# Patient Record
Sex: Female | Born: 1965
Health system: Southern US, Community
[De-identification: ages and names within clinical notes are randomized; demographics above are authoritative.]

## PROBLEM LIST (undated history)

## (undated) DIAGNOSIS — R000 Tachycardia, unspecified: Secondary | ICD-10-CM

## (undated) DIAGNOSIS — G43909 Migraine, unspecified, not intractable, without status migrainosus: Secondary | ICD-10-CM

## (undated) HISTORY — PX: TUBAL LIGATION: SHX77

## (undated) HISTORY — PX: ABDOMINAL HYSTERECTOMY: SHX81

## (undated) HISTORY — PX: CERVICAL ABLATION: SHX5771

---

## 2001-11-23 ENCOUNTER — Other Ambulatory Visit: Admission: RE | Admit: 2001-11-23 | Discharge: 2001-11-23 | Payer: Self-pay | Admitting: Obstetrics and Gynecology

## 2003-02-11 ENCOUNTER — Encounter: Payer: Self-pay | Admitting: Obstetrics and Gynecology

## 2003-02-11 ENCOUNTER — Ambulatory Visit (HOSPITAL_COMMUNITY): Admission: RE | Admit: 2003-02-11 | Discharge: 2003-02-11 | Payer: Self-pay | Admitting: Obstetrics and Gynecology

## 2003-03-17 ENCOUNTER — Other Ambulatory Visit: Admission: RE | Admit: 2003-03-17 | Discharge: 2003-03-17 | Payer: Self-pay | Admitting: Obstetrics & Gynecology

## 2003-04-15 ENCOUNTER — Ambulatory Visit (HOSPITAL_COMMUNITY): Admission: RE | Admit: 2003-04-15 | Discharge: 2003-04-15 | Payer: Self-pay | Admitting: Obstetrics & Gynecology

## 2005-05-18 ENCOUNTER — Inpatient Hospital Stay (HOSPITAL_COMMUNITY): Admission: RE | Admit: 2005-05-18 | Discharge: 2005-05-20 | Payer: Self-pay | Admitting: Obstetrics & Gynecology

## 2005-05-18 ENCOUNTER — Encounter: Payer: Self-pay | Admitting: Obstetrics & Gynecology

## 2006-07-13 ENCOUNTER — Ambulatory Visit (HOSPITAL_COMMUNITY): Admission: RE | Admit: 2006-07-13 | Discharge: 2006-07-13 | Payer: Self-pay | Admitting: Obstetrics & Gynecology

## 2007-07-17 ENCOUNTER — Ambulatory Visit (HOSPITAL_COMMUNITY): Admission: RE | Admit: 2007-07-17 | Discharge: 2007-07-17 | Payer: Self-pay | Admitting: Obstetrics & Gynecology

## 2007-07-25 ENCOUNTER — Encounter: Admission: RE | Admit: 2007-07-25 | Discharge: 2007-07-25 | Payer: Self-pay | Admitting: Obstetrics & Gynecology

## 2008-03-12 ENCOUNTER — Ambulatory Visit (HOSPITAL_COMMUNITY): Admission: RE | Admit: 2008-03-12 | Discharge: 2008-03-12 | Payer: Self-pay | Admitting: Emergency Medicine

## 2008-07-18 ENCOUNTER — Ambulatory Visit (HOSPITAL_COMMUNITY): Admission: RE | Admit: 2008-07-18 | Discharge: 2008-07-18 | Payer: Self-pay | Admitting: Internal Medicine

## 2009-01-14 ENCOUNTER — Encounter (HOSPITAL_COMMUNITY): Admission: RE | Admit: 2009-01-14 | Discharge: 2009-02-13 | Payer: Self-pay | Admitting: Cardiology

## 2009-01-14 ENCOUNTER — Encounter (INDEPENDENT_AMBULATORY_CARE_PROVIDER_SITE_OTHER): Payer: Self-pay | Admitting: Cardiology

## 2009-08-10 ENCOUNTER — Ambulatory Visit (HOSPITAL_COMMUNITY): Admission: RE | Admit: 2009-08-10 | Discharge: 2009-08-10 | Payer: Self-pay | Admitting: Internal Medicine

## 2010-10-10 ENCOUNTER — Encounter: Payer: Self-pay | Admitting: Obstetrics & Gynecology

## 2011-02-01 NOTE — Procedures (Signed)
NAME:  Kayla Meyer, Kayla Meyer NO.:  [id number]   MEDICAL RECORD NO.:  [id number]          PATIENT TYPE:  REC   LOCATION:  RAD                           FACILITY:  [facility]   PHYSICIAN:  Sheliah Mends, MD      DATE OF BIRTH:  [birth year]   DATE OF PROCEDURE:  DATE OF DISCHARGE:                                  STRESS TEST   INDICATION:  This is a 45 year old lady with complaints of palpitations  and multiple risk factors for coronary artery disease.  A cardiac stress  test was scheduled for further risk stratification.   PROCEDURE:  After informed consent was obtained, the patient was started  on a standard Bruce protocol.  She was followed with serial EKG and  hemodynamic monitoring every 3 minutes.  A Technetium Myoview tracer was  injected 1 minute prior to completion of exercise.   EKG FINDINGS:  Baseline EKG shows normal sinus rhythm at a rate of 58  beat per minute, normal axis, and no significant ST-T segment changes.  The patient exercised for 8 minutes and 46 seconds.  She achieved a  maximum heart rate of 170% exceeding 95% from maximum predicted heart  rate.  The patient had a hypertensive blood pressure response to  exercise with baseline blood pressure of 128/78, and a maximum blood  pressure during exercise of 228/62 beat per mmHg.  The serial EKGs  documented during the exercise portion showed 2-mm horizontal ST-  depressions and 2 free aVF and V4 to V6.  The patient did not develop  chest pain.  She did have dyspnea on exertion during exercise.  The EKG  changes resolved within 5 minutes of recovery.  There were no stress-  induced worrisome abnormalities seen.  There were no supraventricular or  ventricular ectopic beats or rhythm noted.   SUMMARY:  1. Adequate stress test based on target heart rate achieved, excellent      exercise tolerance.  2. EKGs positive for ischemia.  This may represent the falls, positive      result context of hypertensive  response to exercise.  3. Hypertensive response to exercise.  4. No evidence of stress-induced worrisome abnormalities.   The stress test was completed without complication.  The nuclear imaging  portion of the stress test will be reported separately.      Sheliah Mends, MD  Electronically Signed     JE/MEDQ  D:  01/14/2009  T:  01/15/2009  Job:  [id number]

## 2011-02-04 NOTE — H&P (Signed)
   NAME:  Kayla Meyer, Kayla Meyer NO.:  [id number]   MEDICAL RECORD NO.:  [id number]                  PATIENT TYPE:   LOCATION:                                       FACILITY:   PHYSICIAN:  Lazaro Arms, M.D.                DATE OF BIRTH:  1965/10/16   DATE OF ADMISSION:  DATE OF DISCHARGE:                                HISTORY & PHYSICAL   HISTORY OF PRESENT ILLNESS:  This patient is a 45 year old white female  gravida 2, para 2, status post tubal ligation.  She had a Pap smear  performed in the office in May 2004 which showed atypical squamous cells of  undetermined significance, but could not exclude high grade dysplasia.  She  also had high-risk HPV DNA and was negative for low risk.  As a result she  was brought into the office and had a colposcopy performed on June 28. At  the time of colposcopy I saw what I thought would turn out to be high-grade  dysplastic area, and this area was biopsied. It returned as moderate to high-  grade squamous dysplasia involving the endocervical glands and both lateral  margins. Due to the grade of the lesion and the patient's age I did not feel  that cryotherapy would be indicated; and, as a result, I have recommended a  laser ablation of the cervix.  The patient agrees and will proceed. That is  scheduled for April 15, 2003.   PAST MEDICAL HISTORY:  1. Migraines.  2. Hypothyroidism.   PAST SURGICAL HISTORY:  Tubal ligation.   PAST OBSTETRICAL HISTORY:  Two vaginal deliveries.   MEDICATIONS:  Synthroid 75 mcg.   REVIEW OF SYSTEMS:  Otherwise negative.   ALLERGIES:  BACTRIM and SULFA DRUGS.   SOCIAL HISTORY:  She does not currently smoke and alcohol only socially. She  works as a Engineer, civil (consulting) in the ICU.   PHYSICAL EXAMINATION:  HEENT:  Unremarkable.  NECK:  Thyroid is normal.  LUNGS:  Clear.  HEART:  Regular rhythm.  No regurgitation or gallop.  BREASTS: Deferred.  ABDOMEN:  Benign.  PELVIC:  Normal.  EXTREMITIES:  No edema.  NEUROLOGIC: Grossly intact.    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   PLAN:  The patient is admitted for laser ablation of the cervix.  She  understands the indications and will proceed.                                               Lazaro Arms, M.D.    [name]  D:  04/14/2003  T:  04/14/2003  Job:  [id number]

## 2011-02-04 NOTE — Op Note (Signed)
   NAME:  Kayla Meyer, Kayla Meyer                        ACCOUNT NO.:  [id number]   MEDICAL RECORD NO.:  [id number]                   PATIENT TYPE:  AMB   LOCATION:  DAY                                  FACILITY:  [facility]   PHYSICIAN:  Lazaro Arms, M.D.                DATE OF BIRTH:  1966-03-28   DATE OF PROCEDURE:  04/15/2003  DATE OF DISCHARGE:                                 OPERATIVE REPORT   PREOPERATIVE DIAGNOSIS:  Cervical dysplasia, high-grade.   POSTOPERATIVE DIAGNOSIS:  Cervical dysplasia, high-grade.   PROCEDURE:  Holmium laser ablation of the cervix.   SURGEON:  Lazaro Arms, M.D.   ANESTHESIA:  Laryngeal mask airway.   FINDINGS:  The patient underwent a colposcopy with directed biopsies in the  office after an abnormal Pap smear and was found to have high-grade squamous  intraepithelial lesion with invasion into the endocervical glands.  As a  result, we decided to do a laser ablation of the cervix.   DESCRIPTION OF PROCEDURE:  The patient was taken to the operating room and  placed in the supine position where she underwent a laryngeal mask airway  general anesthetic.  She was then placed in the dorsal lithotomy position.  The lower abdomen and vagina were prepped with Betadine.  The bladder was  drained.  A speculum was placed.  The cervix was grasped.  A paracervical  block was placed using 0.5% Marcaine with 1:200,000 epinephrine.  Acetic  acid was then placed in the cervix, and the holmium laser was used to do a  full transitional zone ablation of the cervix 7 cm in depth peripherally,  cutting down to 9 centrally.  There was good hemostasis with the holmium  laser, and Monsel's was placed for additional hemostasis.   The patient was awakened from anesthesia and taken to the recovery room in  good and stable condition.  All counts were correct.                                               Lazaro Arms, M.D.    [name]  D:  04/15/2003  T:  04/15/2003  Job:   [id number]

## 2011-02-04 NOTE — Discharge Summary (Signed)
Kayla Meyer, POSCH NO.:  [id number]   MEDICAL RECORD NO.:  [id number]          PATIENT TYPE:  INP   LOCATION:  A417                          FACILITY:  [facility]   PHYSICIAN:  Lazaro Arms, M.D.   DATE OF BIRTH:  08/13/1966   DATE OF ADMISSION:  05/18/2005  DATE OF DISCHARGE:  09/01/2006LH                                 DISCHARGE SUMMARY   DISCHARGE DIAGNOSES:  1.  Status post total abdominal hysterectomy/bilateral salpingo-      oophorectomy.  2.  Unremarkable postoperative course.   PROCEDURES:  As above.   HISTORY OF PRESENT ILLNESS:  Please refer to the transcribed History and  Physical and the operative report for details of admission in the hospital.   HOSPITAL COURSE:  The patient was admitted after surgery.  She tolerated  clear liquids and a regular diet.  She voided without symptoms and was  ambulatory.  She tolerated transition from IV to oral pain medicine.  Her  postop day #1 hemoglobin and hematocrit was 12.5 and 35.5.  Her incision was  clean, dry and intact.  She remained afebrile with stable vital signs.   She was discharged to home on the morning of postop day #2 in good and  stable condition.   FOLLOW UP:  Follow up in the office on Tuesday.   DISCHARGE MEDICATIONS:  1.  Tylox and Motrin for pain.  2.  Climara patch in place.      Lazaro Arms, M.D.  Electronically Signed     LHE/MEDQ  D:  05/20/2005  T:  05/20/2005  Job:  [id number]

## 2011-02-04 NOTE — H&P (Signed)
NAME:  KEIMANI, LAUFER NO.:  [id number]   MEDICAL RECORD NO.:  [id number]          PATIENT TYPE:  INP   LOCATION:  A417                          FACILITY:  [facility]   PHYSICIAN:  Lazaro Arms, M.D.   DATE OF BIRTH:  [birth year]   DATE OF ADMISSION:  05/18/2005  DATE OF DISCHARGE:  09/01/2006LH                                HISTORY & PHYSICAL   HISTORY OF PRESENT ILLNESS:  Kayla Meyer is a 45 year old white female, gravida 2,  para 2, status post tubal ligation in 1995, status post laser of the cervix  back in 2004 for high-grade dysplasia. She also suffers from migraines and  hypothyroidism.  Kayla Meyer has been having irregular bleeding now since the  beginning of 2005. She will have a regular period, and then 10 to 12 days  later she will start spotting again until her period starts.  Sometimes it  is spotting, but sometimes it is heavier bleeding and cramping associated.  We actually gave her some Megace to get her bleeding stopped originally,  then placed her on Yasmin, and she continued to have dysfunctional uterine  bleeding. We then placed her on Ovcon-35, and again that has had some  success, but she continues to have breakthrough bleeding despite the birth  control pills.  As a result, we discussed endometrial ablation versus  hysterectomy, and that was back in January. She also had a Pap smear which  came back showing low-grade dysplasia this time. We had to put her back on  Megace to control her bleeding, and she decided she wanted definitive  management with a hysterectomy.  She also has desire to have both of her  ovaries removed.   PAST MEDICAL HISTORY:  1.  Migraines.  2.  Hypothyroidism.  3.  Recurrent cervical dysplasia.   PAST SURGICAL HISTORY:  1.  Tubal ligation in 1995.  2.  Laser of cervix in 2004.   CURRENT MEDICATIONS:  1.  Synthroid 88 mcg a day.  2.  Megace 40 mg a day.  3.  Tylenol as needed.  4.  Ibuprofen as needed.   ALLERGIES:  SULFA  DRUGS.   REVIEW OF SYSTEMS:  Otherwise negative.   PHYSICAL EXAMINATION:  VITAL SIGNS:  Her weight is 195 pounds.  Blood  pressure 120/80.  HEENT:  Unremarkable.  NECK:  Thyroid normal.  LUNGS: Clear.  HEART:  Regular rate and rhythm without murmur, regurgitation, or gallop.  BREASTS:  Without mass, discharge, skin changes.  ABDOMEN:  Benign. No hepatosplenomegaly or masses.  PELVIC:  She has normal external genitalia.  Vagina is pink and moist  without discharge. The cervix is clear without lesions.  Uterus normal size,  shape, contour.  Ovaries normal and nontender.  EXTREMITIES:  Warm with no edema.  NEUROLOGIC:  Exam is grossly intact.   IMPRESSION:  1.  Dysfunctional uterine bleeding and menorrhagia despite hormone      manipulation for almost 2 years.  2.  Dysmenorrhea.  3.  Dyspareunia.   PLAN:  The patient is admitted to [facility].  She understands the risks,  benefits, indications, and  alternatives.  Will proceed.      Lazaro Arms, M.D.  Electronically Signed     LHE/MEDQ  D:  05/17/2005  T:  05/17/2005  Job:  [id number]

## 2011-02-04 NOTE — Op Note (Signed)
NAME:  Kayla Meyer, Kayla Meyer NO.:  [id number]   MEDICAL RECORD NO.:  [id number]          PATIENT TYPE:  AMB   LOCATION:  DAY                           FACILITY:  [facility]   PHYSICIAN:  Lazaro Arms, M.D.   DATE OF BIRTH:  1966-01-31   DATE OF PROCEDURE:  05/18/2005  DATE OF DISCHARGE:                                 OPERATIVE REPORT   PREOPERATIVE DIAGNOSIS:  1.  Dysfunctional uterine bleeding.  2.  Dysmenorrhea.  3.  Recurrent cervical dysplasia.   POSTOPERATIVE DIAGNOSIS:  1.  Dysfunctional uterine bleeding.  2.  Dysmenorrhea.  3.  Recurrent cervical dysplasia.   PROCEDURE:  Total abdominal hysterectomy/bilateral salpingo-oophorectomy.   SURGEON:  Dr. Despina Hidden.   ANESTHESIA:  General endotracheal.   FINDINGS:  The patient had what appeared to be a normal uterus, tubes, and  ovaries. There were no fibroids, endometriosis or ovarian cysts. The  peritoneal cavity was otherwise normal.   DESCRIPTION OF PROCEDURE:  The patient was taken to the operating room,  placed in the supine position where she underwent general endotracheal  anesthesia. She was then frog legged, the vagina was prepped, Foley catheter  was placed, and then she was placed in the supine position. Her abdomen was  prepped and draped in the usual sterile fashion. Pfannenstiel skin incision  was made and carried down sharply to the rectus fascia which was scored in  the midline and extended laterally. The fascia was taken off of the muscles  superiorly and inferiorly without difficulty. The muscles were divided. The  peritoneal cavity was entered. A Balfour self-retaining retractor was  placed. The upper abdomen was packed away for visualization. The uterine  cornu were grasped. The left round ligament was suture ligated and cut. The  infundibulopelvic ligament was isolated, clamped, cut, and double suture  ligated. The right round ligament was suture ligated and cut. The  infundibulopelvic ligament  on the right was isolated, clamped, and double  suture ligated. The uterine vessels were skeletonized bilaterally. Uterine  vessels were clamped, cut, and suture ligated. Serial pedicles were taken  down, the cervix through the cardinal ligament, each pedicle being clamped,  cut, and suture ligated. The vesicouterine serosal flap was, of course,  created prior to this, and the bladder was pushed off the lower uterine  segment without difficulty. The vaginal angles were approached. They were  clamped, cut, and transfixation suture ligated, and the vagina was closed.  With interrupted figure-of-eight sutures, the pelvis was irrigated  vigorously. There was some bleeding of the back of the bladder, and a couple  of 3-0 Monocryl sutures were placed with good hemostasis. Additional  irrigation was performed and again all pedicles hemostatic. Interceed was  placed over the vaginal cuff to try to prevent postoperative adhesion  perforation. The [place] and all of the packs were removed. The muscles in  the peritoneum were reapproximated loosely. The fascia  closed using 0 Vicryl running. Subcutaneous tissue was made hemostatic and  irrigated. Skin was closed using skin staples. The patient tolerated the  procedure well. She experienced 150 cc of blood loss  and was taken to  recovery in good and stable condition. All counts correct x3.      Lazaro Arms, M.D.  Electronically Signed     LHE/MEDQ  D:  05/18/2005  T:  05/18/2005  Job:  [id number]

## 2012-05-17 ENCOUNTER — Other Ambulatory Visit (HOSPITAL_COMMUNITY): Payer: Self-pay | Admitting: Family Medicine

## 2012-05-17 ENCOUNTER — Ambulatory Visit (HOSPITAL_COMMUNITY)
Admission: RE | Admit: 2012-05-17 | Discharge: 2012-05-17 | Disposition: A | Discharge status: Home / Self Care | Payer: 59 | Source: Ambulatory Visit | Attending: Family Medicine | Admitting: Family Medicine

## 2012-05-17 DIAGNOSIS — J069 Acute upper respiratory infection, unspecified: Secondary | ICD-10-CM

## 2012-05-17 DIAGNOSIS — J209 Acute bronchitis, unspecified: Secondary | ICD-10-CM | POA: Insufficient documentation

## 2012-05-29 ENCOUNTER — Other Ambulatory Visit (HOSPITAL_COMMUNITY): Payer: Self-pay | Admitting: Internal Medicine

## 2012-05-29 DIAGNOSIS — Z139 Encounter for screening, unspecified: Secondary | ICD-10-CM

## 2012-06-01 ENCOUNTER — Ambulatory Visit (HOSPITAL_COMMUNITY)
Admission: RE | Admit: 2012-06-01 | Discharge: 2012-06-01 | Disposition: A | Discharge status: Home / Self Care | Payer: 59 | Source: Ambulatory Visit | Attending: Internal Medicine | Admitting: Internal Medicine

## 2012-06-01 DIAGNOSIS — Z139 Encounter for screening, unspecified: Secondary | ICD-10-CM

## 2012-06-01 DIAGNOSIS — Z1231 Encounter for screening mammogram for malignant neoplasm of breast: Secondary | ICD-10-CM | POA: Insufficient documentation

## 2015-09-01 ENCOUNTER — Emergency Department (HOSPITAL_COMMUNITY)
Admission: EM | Admit: 2015-09-01 | Discharge: 2015-09-01 | Disposition: A | Discharge status: Home / Self Care | Payer: PRIVATE HEALTH INSURANCE | Attending: Emergency Medicine | Admitting: Emergency Medicine

## 2015-09-01 ENCOUNTER — Emergency Department (HOSPITAL_COMMUNITY): Payer: PRIVATE HEALTH INSURANCE

## 2015-09-01 ENCOUNTER — Encounter (HOSPITAL_COMMUNITY): Payer: Self-pay | Admitting: *Deleted

## 2015-09-01 ENCOUNTER — Telehealth: Payer: Self-pay | Admitting: Orthopedic Surgery

## 2015-09-01 DIAGNOSIS — Y998 Other external cause status: Secondary | ICD-10-CM | POA: Diagnosis not present

## 2015-09-01 DIAGNOSIS — S82832A Other fracture of upper and lower end of left fibula, initial encounter for closed fracture: Secondary | ICD-10-CM

## 2015-09-01 DIAGNOSIS — F17200 Nicotine dependence, unspecified, uncomplicated: Secondary | ICD-10-CM | POA: Insufficient documentation

## 2015-09-01 DIAGNOSIS — Z8679 Personal history of other diseases of the circulatory system: Secondary | ICD-10-CM | POA: Insufficient documentation

## 2015-09-01 DIAGNOSIS — S92352A Displaced fracture of fifth metatarsal bone, left foot, initial encounter for closed fracture: Secondary | ICD-10-CM | POA: Insufficient documentation

## 2015-09-01 DIAGNOSIS — S82492A Other fracture of shaft of left fibula, initial encounter for closed fracture: Secondary | ICD-10-CM | POA: Diagnosis not present

## 2015-09-01 DIAGNOSIS — X58XXXA Exposure to other specified factors, initial encounter: Secondary | ICD-10-CM | POA: Diagnosis not present

## 2015-09-01 DIAGNOSIS — Z79899 Other long term (current) drug therapy: Secondary | ICD-10-CM | POA: Insufficient documentation

## 2015-09-01 DIAGNOSIS — S99922A Unspecified injury of left foot, initial encounter: Secondary | ICD-10-CM | POA: Diagnosis present

## 2015-09-01 DIAGNOSIS — Y9289 Other specified places as the place of occurrence of the external cause: Secondary | ICD-10-CM | POA: Diagnosis not present

## 2015-09-01 DIAGNOSIS — Y9389 Activity, other specified: Secondary | ICD-10-CM | POA: Insufficient documentation

## 2015-09-01 HISTORY — DX: Tachycardia, unspecified: R00.0

## 2015-09-01 HISTORY — DX: Migraine, unspecified, not intractable, without status migrainosus: G43.909

## 2015-09-01 MED ORDER — OXYCODONE-ACETAMINOPHEN 5-325 MG PO TABS: 1.0000 | ORAL_TABLET | ORAL | Status: DC | PRN

## 2015-09-01 NOTE — Telephone Encounter (Signed)
Next week

## 2015-09-01 NOTE — Telephone Encounter (Signed)
Call received from patient, who states she is an employee of [employer], requesting appointment for work-related injury, date of occurrence 09/01/15, for which she was treated at [facility].  Relayed Workers Camera operatorcomp protocol for [facility]. States she has reported it to her supervisor, as well as Field seismologistcontacting Employee Health, and was advised to print off the form from website, per Horald PollenSue Hampton.  Appointment pending verification; patient aware of status.  Patient Ph# [phone]

## 2015-09-01 NOTE — ED Notes (Signed)
Pt c/o left ankle pain after turning around and hearing a popping sound in her ankle

## 2015-09-01 NOTE — Discharge Instructions (Signed)
Fibular Fracture With Rehab °The fibula is the smaller of the two lower leg bones and is vulnerable to breaks (fracture). Fibular fractures may go fully through the bone (complete) or partially (incomplete). The bone fragments are rarely out of alignment (displaced fracture). Fibula fractures may occur anywhere along the bone. However, this document only discusses fractures that do not involve a leg joint. Fibular fractures are not often a severe injury because the bone supports only about 17% of the body weight. °SYMPTOMS  °· Moderate to severe pain in the lower leg. °· Tenderness and swelling in the leg or calf. °· Bleeding and/or bruising (contusion) in the leg. °· Inability to bear weight on the injured extremity. °· Visible deformity, if the fracture is displaced. °· Numbness and coldness in the leg and foot, beyond the fracture site, if blood supply is impaired. °CAUSES  °Fractures occur when a force is placed on the bone that is greater than it can withstand. Common causes of fibular fracture include: °· Direct hit (trauma) (i.e., hockey or lacrosse check to the lower leg). °· Stress fracture (weakening of the bone from repeated stress). °· Indirect injury, caused by twisting, turning quickly, or violent muscle contraction. °RISK INCREASES WITH: °· Contact sports (i.e., football, soccer, lacrosse, hockey). °· Sports that can cause twisted ankle injury (i.e., skiing, basketball). °· Bony abnormalities (i.e., osteoporosis or bone tumors). °· Metabolism disorders, hormone problems, and nutrition deficiency and disorders (i.e., anorexia and bulimia). °· Poor strength and flexibility. °PREVENTION  °· Warm up and stretch properly before activity. °· Maintain physical fitness: °¨ Strength, flexibility, and endurance. °¨ Cardiovascular fitness. °· Wear properly fitted and padded protective equipment (i.e., shin guards for soccer). °PROGNOSIS  °If treated properly, fibular fractures usually heal in 4 to 6 weeks.    °RELATED COMPLICATIONS  °· Failure of bone to heal (nonunion). °· Bone heals in a poor position (malunion). °· Increased pressure inside the leg (compartment syndrome) due to injury that disrupts the blood supply to the leg and foot and injures the nerves and muscles (uncommon). °· Shortening of the injured bones. °· Hindrance of normal bone growth in children. °· Risks of surgery: infection, bleeding, injury to nerves (numbness, weakness, paralysis), need for further surgery. °· Longer healing time if activity is resumed too soon. °TREATMENT °Treatment first involves ice, medicine, and elevation of the leg to reduce pain and inflammation. People with fibular fractures are advised to walk using crutches. A brace or walking boot may be given to restrain the injured leg and allow for healing. Sometimes, surgery is needed to place a rod, plate, or screws in the bones in order to fix the fracture. After surgery, the leg is restrained. After restraint (with or without surgery), it is important to complete strengthening and stretching exercises to regain strength and a full range of motion. Exercises may be completed at home or with a therapist. °MEDICATION  °· If pain medicine is needed, nonsteroidal anti-inflammatory medicines (aspirin and ibuprofen), or other minor pain relievers (acetaminophen), are often advised. °· Do not take pain medicine for 7 days before surgery. °· Prescription pain relievers may be given if your health care provider thinks they are needed. Use only as directed and only as much as you need. °SEEK MEDICAL CARE IF: °· Symptoms get worse or do not improve in 2 weeks, despite treatment. °· The following occur after restraint or surgery. (Report any of these signs immediately): °¨ Swelling above or below the fracture site. °¨ Severe, persistent pain. °¨   Blue or gray skin below the fracture site, especially under the toenails. Numbness or loss of feeling below the fracture site. °¨ New, unexplained  symptoms develop. (Drugs used in treatment may produce side effects.) ° °

## 2015-09-01 NOTE — Telephone Encounter (Signed)
Contacted patient, and Workers comp contact Horald PollenSue Hampton at [facility], ph# [phone], with appointment information per Dr Mort SawyersHarrison's review: Monday, 09/07/15, 9:00a.m.  Forms in process of completion.

## 2015-09-07 ENCOUNTER — Ambulatory Visit (INDEPENDENT_AMBULATORY_CARE_PROVIDER_SITE_OTHER): Payer: Worker's Compensation

## 2015-09-07 ENCOUNTER — Encounter: Payer: Self-pay | Admitting: Orthopedic Surgery

## 2015-09-07 ENCOUNTER — Ambulatory Visit (INDEPENDENT_AMBULATORY_CARE_PROVIDER_SITE_OTHER): Payer: Worker's Compensation | Admitting: Orthopedic Surgery

## 2015-09-07 VITALS — HR 80 | Ht 64.0 in | Wt 225.0 lb

## 2015-09-07 DIAGNOSIS — S92252A Displaced fracture of navicular [scaphoid] of left foot, initial encounter for closed fracture: Secondary | ICD-10-CM

## 2015-09-07 DIAGNOSIS — S92352A Displaced fracture of fifth metatarsal bone, left foot, initial encounter for closed fracture: Secondary | ICD-10-CM | POA: Diagnosis not present

## 2015-09-07 DIAGNOSIS — S82832A Other fracture of upper and lower end of left fibula, initial encounter for closed fracture: Secondary | ICD-10-CM | POA: Diagnosis not present

## 2015-09-07 MED ORDER — IBUPROFEN 800 MG PO TABS: 800.0000 mg | ORAL_TABLET | Freq: Three times a day (TID) | ORAL | Status: DC | PRN

## 2015-09-07 MED ORDER — HYDROCODONE-ACETAMINOPHEN 7.5-325 MG PO TABS: 1.0000 | ORAL_TABLET | Freq: Four times a day (QID) | ORAL | Status: DC | PRN

## 2015-09-07 NOTE — Patient Instructions (Addendum)
WEIGHT BEARING AS TOLERATED IN CAM WALKER  OUT OF WORK 4 WEEKS

## 2015-09-07 NOTE — Progress Notes (Signed)
New patient with fracture  Workers Lawyer Complaint  Patient presents with  . 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 She presents to [place] with a new injury.  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 She was taken to the ER by security. X-rays and evaluation revealed she had 2 fractures one on the fibula and one on the proximal fifth metatarsal. Note plain films of the fifth metatarsal fracture were incomplete and had to be completed in the office.  She has continued constant pain and swelling left foot and left ankle over the fracture sites which originally was severe and now is more moderate and is improved with the use of a Cam Walker and crutches.    Foot Injury    Review of systems numbness and tingling negative vascular symptoms in the foot negative denies any other musculoskeletal pain and no fever and no skin issues  Ht  (1.626 m)  Wt 225 lb (102.059 kg)  BMI 38.60 kg/m2  She is awake alert and oriented 3. Mood affect normal. Appearance normal grooming hygiene. She is ambulatory today with a Cam Walker Left lower extremity examination normal range of motion of the hip and knee and no instability or tenderness in the hip and knee The foot is swollen as is the ankle. Tenderness in the proximal fifth metatarsal and distal fibula. Ankle range of motion is limited by pain but is near normal. Ankle stability tests are limited by pain and swelling but appear to be stable on drawer test  Skin is intact over the foot ankle. Sensation remains normal to soft touch we do see a bruise on the plantar aspect of the foot. Achilles tendon palpable intact. Good color and pulses are noted in the foot.  The original plain  films at [facility] are independently interpreted as follows. Ankle mortise intact distal fibula fracture, avulsed Independent interpretation of the plain films of the left foot taken in the office proximal avulsion fracture fifth metatarsal nondisplaced fracture gap and separation noted   Encounter Diagnoses  Name Primary?  . Fracture of fifth metatarsal bone of left foot, closed, initial encounter Yes  . Closed avulsion fracture of distal fibula, left, initial encounter    Plan of action, weightbearing as tolerated in Cam Walker  Patient's job duties require 12 hours of weightbearing and she will be out the next 4 weeks and then the fractures will be x-rayed, her pain swelling and ambulatory ability will be reassessed, and we will determine further out of work status at that point.  She is to continue ibuprofen 800, 3 times a day, finish her Percocet as needed and then she can take Norco 7.5 mg every 6  Meds ordered this encounter  Medications  . loratadine (CLARITIN) 10 MG tablet    Sig: Take 10 mg by mouth daily.  Marland Kitchen ibuprofen (ADVIL,MOTRIN) 800 MG tablet    Sig: Take 1 tablet (800 mg total) by mouth every 8 (eight) hours as needed.    Dispense:  90 tablet    Refill:  0  . HYDROcodone-acetaminophen (NORCO) 7.5-325 MG tablet    Sig: Take 1 tablet by mouth every 6 (six) hours as needed for moderate pain.    Dispense:  84 tablet    Refill:  0   ICD9 CODES 825.25 AND 824.8 ICD10 CODES S92.352A  And J81.191YS82.832A  Fracture care codes : metatarsal-28470  And fibula/lateral mall -  [phone]

## 2015-09-08 ENCOUNTER — Telehealth: Payer: Self-pay | Admitting: Orthopedic Surgery

## 2015-09-08 NOTE — Telephone Encounter (Signed)
Notes, date of service (intial visit) 09/07/15 faxed via HIM/ROI request to [facility], to attention: Threasa BeardsVanessa Corbett, fax#[phone]/ph# [phone] / patient aware.

## 2015-09-14 NOTE — ED Provider Notes (Signed)
CSN: [id number]     Arrival date & time 09/01/15  0201 History   First MD Initiated Contact with Patient 09/01/15 0226     Chief Complaint  Patient presents with  . Fall     (Consider location/radiation/quality/duration/timing/severity/associated sxs/prior Treatment) HPI   49 year old female with left foot/ankle pain. Acute onset shortly before arrival. Patient felt a pop while turning with a planted foot. Persistent pain over the lateral aspect of foot and ankle since then. Denies any other acute injuries. No acute numbness or tingling.  Past Medical History  Diagnosis Date  . Migraine   . Tachycardia    Past Surgical History  Procedure Laterality Date  . Abdominal hysterectomy    . Cervical ablation    . Tubal ligation     History reviewed. No pertinent family history. Social History  Substance Use Topics  . Smoking status: Current Every Day Smoker -- 0.33 packs/day  . Smokeless tobacco: None  . Alcohol Use: Yes     Comment: rarely   OB History    No data available     Review of Systems  All systems reviewed and negative, other than as noted in HPI.   Allergies  Sulfa antibiotics  Home Medications   Prior to Admission medications   Medication Sig Start Date End Date Taking? Authorizing Provider  HYDROcodone-acetaminophen (NORCO) 7.5-325 MG tablet Take 1 tablet by mouth every 6 (six) hours as needed for moderate pain. 09/07/15   Vickki Hearing, MD  ibuprofen (ADVIL,MOTRIN) 800 MG tablet Take 1 tablet (800 mg total) by mouth every 8 (eight) hours as needed. 09/07/15   Vickki Hearing, MD  loratadine (CLARITIN) 10 MG tablet Take 10 mg by mouth daily.    Historical Provider, MD  oxyCODONE-acetaminophen (PERCOCET/ROXICET) 5-325 MG tablet Take 1-2 tablets by mouth every 4 (four) hours as needed. 09/01/15   Raeford Razor, MD   BP 142/64 mmHg  Pulse 77  Temp(Src) 98 F (36.7 C) (Oral)  Resp 18  Ht  (1.626 m)  Wt 225 lb (102.059 kg)  BMI 38.60 kg/m2   SpO2 97% Physical Exam  Constitutional: She appears well-developed and well-nourished. No distress.  HENT:  Head: Normocephalic and atraumatic.  Eyes: Conjunctivae are normal. Right eye exhibits no discharge. Left eye exhibits no discharge.  Neck: Neck supple.  Cardiovascular: Normal rate, regular rhythm and normal heart sounds.  Exam reveals no gallop and no friction rub.   No murmur heard. Pulmonary/Chest: Effort normal and breath sounds normal. No respiratory distress.  Abdominal: Soft. She exhibits no distension. There is no tenderness.  Musculoskeletal:  Tenderness to palpation over the left lateral foot area the basis of the fourth and fifth metatarsals. Closed injury. Point tenderness to palpation over the lateral malleolus. Palpable DP pulse. Sensation is intact to light touch.  Neurological: She is alert.  Skin: Skin is warm and dry.  Psychiatric: She has a normal mood and affect. Her behavior is normal. Thought content normal.  Nursing note and vitals reviewed.   ED Course  Procedures (including critical care time) Labs Review Labs Reviewed - No data to display  Imaging Review No results found. I have personally reviewed and evaluated these images and lab results as part of my medical decision-making.   EKG Interpretation None      MDM   Final diagnoses:  Closed avulsion fracture of distal fibula, left, initial encounter  Dancer's fracture, left, closed, initial encounter    49 year old female was closed fractures of  the left foot and ankle. Neurovascularly intact. Will place a Cam Walker. Crutches. Orthopedic follow-up. As needed pain medication.    Raeford RazorStephen Jazier Mcglamery, MD 09/14/15 [phone]

## 2015-09-30 ENCOUNTER — Telehealth: Payer: Self-pay | Admitting: Orthopedic Surgery

## 2015-09-30 NOTE — Telephone Encounter (Signed)
Patient called to relay that she has been contacted by Workers comp/[facility], that she is being scheduled with another orthopedic surgeon for her follow up fracture care for work-related injury of 09/01/15; therefore, states she is to cancel her appointment with our office for 10/05/15; patient states that Workers comp Financial risk analystnurse case manager, with whom she has been assigned, Jonne PlyCindy Felix, was to call us directly to notify of this information.  We have not been notified by anyone from Workers comp as of yet. As per patient's call, I have cancelled the appointment with Dr Romeo AppleHarrison, as she was advised to do, and I have contacted the following Workers comp contacts: (1) Horald PollenSue Hampton, [facility], now known as "[employer]", Ph# [phone], Fax# [phone] (I left voice message requesting a call back in reference to this information); and (2) Alice ReichertSandra McDaniel, adjuster at [facility], Ph# [phone], Fax# [phone] (also left voice message requesting a call back regarding this information).  Patient is aware that I have called the Workers comp contacts directly.  She thanks all of us for providing excellent care.

## 2015-10-01 NOTE — Telephone Encounter (Signed)
Lots of great info   Not sure i need a phone note

## 2015-10-05 ENCOUNTER — Ambulatory Visit: Payer: Self-pay | Admitting: Orthopedic Surgery

## 2015-11-03 ENCOUNTER — Ambulatory Visit (HOSPITAL_COMMUNITY): Payer: PRIVATE HEALTH INSURANCE | Attending: Orthopedic Surgery | Admitting: Physical Therapy

## 2015-11-03 DIAGNOSIS — R2681 Unsteadiness on feet: Secondary | ICD-10-CM

## 2015-11-03 DIAGNOSIS — M25572 Pain in left ankle and joints of left foot: Secondary | ICD-10-CM

## 2015-11-03 DIAGNOSIS — M259 Joint disorder, unspecified: Secondary | ICD-10-CM | POA: Insufficient documentation

## 2015-11-03 DIAGNOSIS — R29898 Other symptoms and signs involving the musculoskeletal system: Secondary | ICD-10-CM

## 2015-11-03 DIAGNOSIS — M25672 Stiffness of left ankle, not elsewhere classified: Secondary | ICD-10-CM | POA: Diagnosis present

## 2015-11-03 DIAGNOSIS — R262 Difficulty in walking, not elsewhere classified: Secondary | ICD-10-CM | POA: Diagnosis present

## 2015-11-03 DIAGNOSIS — Z8781 Personal history of (healed) traumatic fracture: Secondary | ICD-10-CM | POA: Diagnosis present

## 2015-11-03 NOTE — Therapy (Signed)
[facility] [address] [place], [place], [address] Phone: [phone]   Fax:  [phone]  Physical Therapy Evaluation  Patient Details  Name: VALLEY KE MRN: [id number] Date of Birth: 1966/02/12 Referring Provider: Jodi Geralds   Encounter Date: 11/03/2015      PT End of Session - 11/03/15 0946    Visit Number 1   Number of Visits 12   Date for PT Re-Evaluation 12/01/15   Authorization Type Worker's Comp/UMR (primary is worker's comp and has limit of 12 sessions approved)   Authorization Time Period 11/03/15 to 01/01/16   Authorization - Visit Number 1   Authorization - Number of Visits 12   PT Start Time [phone]   PT Stop Time 0930   PT Time Calculation (min) 39 min   Activity Tolerance Patient tolerated treatment well   Behavior During Therapy [facility] for tasks assessed/performed      Past Medical History  Diagnosis Date  . Migraine   . Tachycardia     Past Surgical History  Procedure Laterality Date  . Abdominal hysterectomy    . Cervical ablation    . Tubal ligation      There were no vitals filed for this visit.  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 When she got to the bottom floor, she took some materials carts into the supply room, turned in the hallway, and fell down, fracturing the ankle. Does not remember what caused the fall. Since then she has had a total of 18 x-rays done on the  foot, lateral fibula and 5th metatarsal avlusion fracture as ultimate diagnosis. Extenstion is painful.    Pertinent History Hx of tachycardia, no other significant PMH in chart or patient reported. Reports she is now FWB with shoe. Hx 5th metatarsal and lateral fib fracture per patient.    How long can you stand comfortably? tends to stand with weight off of L foot, maybe could stand 15-20 minutes    How long can you walk comfortably? 30-60 minutes    Patient Stated Goals get walking better, get regular gait pattern back, get better at stairs    Currently in Pain? Yes   Pain Score 4    Pain Location Foot   Pain Orientation Left;Lateral   Pain Descriptors / Indicators Throbbing   Pain Type Acute pain   Pain Onset More than a month ago   Pain Frequency Intermittent   Aggravating Factors  walking and standing   Pain Relieving Factors brace, RICE             [facility] PT Assessment - 11/03/15 0001    Assessment   Medical Diagnosis hx of L ankle fracture    Referring Provider Jodi Geralds    Onset Date/Surgical Date 09/01/15   Next MD Visit 11/26/15 with Dr. Luiz Blare    Precautions   Precautions Other (comment)   Precaution Comments needs show for  weight bearing    Restrictions   Weight Bearing Restrictions No   Balance Screen   Has the patient fallen in the past 6 months Yes   How many times? one fall that caused fracture; no other falls in past 6 months    Has the patient had a decrease in activity level because of a fear of falling?  No   Is the patient reluctant to leave their home because of a fear of falling?  No   Prior Function   Level of Independence Independent with basic ADLs;Independent;Independent with gait;Independent with transfers   Vocation Full time employment   Vocation Requirements administrator/coordinator    Leisure walking   Observation/Other Assessments   Observations SLS R 37 seconds with intermittent fingertouch; SLS L 4 seconds   Focus on Therapeutic Outcomes  (FOTO)  55% limited    Posture/Postural Control   Posture Comments reduced weight bearing L LE    AROM   Right Ankle Dorsiflexion 5  AAROM 9   Right Ankle Plantar Flexion --  wfl    Right Ankle Inversion 35   Right Ankle Eversion 20   Left Ankle Dorsiflexion 5  8 AAROM    Left Ankle Plantar Flexion --  wfl    Left Ankle Inversion 18   Left Ankle Eversion 15   Strength   Right Knee Flexion 4/5   Right Knee Extension 4/5   Left Knee Flexion 4+/5   Left Knee Extension 4/5   Right Ankle Dorsiflexion 5/5   Right Ankle Inversion 5/5   Right Ankle Eversion 5/5   Left Ankle Dorsiflexion 3+/5   Left Ankle Inversion 3+/5   Left Ankle Eversion 3/5  tender, painful    Ambulation/Gait   Gait Comments reduced push off and DF L ankle, reduced gait speed, proximal muscle weakness    6 minute walk test results    Aerobic Endurance Distance Walked 407   Endurance additional comments ; gait speed 0.105m/s    High Level Balance   High Level Balance Comments TUG 13.17                            PT Education - 11/03/15 0946    Education provided Yes   Education Details prognosis, plan of care, HEP    Person(s) Educated Patient   Methods Explanation;Demonstration;Handout   Comprehension Verbalized understanding;Returned demonstration          PT Short Term Goals - 11/03/15 1002    PT SHORT TERM GOAL #1   Title Patient will demonstrate L ankle ROM equal to that of R ankle on all planes in order to assist in reducing pain and improving general mechanics and mobility    Time 3   Period Weeks   Status New   PT SHORT TERM GOAL #2   Title Patient to demonstrate reduced edema as evidenced by zero indentations from ankle brace in order to demosntrate general improvement of condition and to assist in improving ROM    Time 3   Period Weeks   Status New   PT SHORT TERM GOAL #3   Title Patient to demonstrate improved gait mechanics including equal weight bearing and  step lengths, improved push off, improved L ankle dorsiflexion, and improved gait spped to assist in safe returnt o community based activities    Time 3   Period Weeks   Status New   PT SHORT TERM GOAL #4   Title  Patient to be independent in correctly and consistently performing appropriate HEP, to be updated PRN    Time 3   Period Weeks   Status New           PT Long Term Goals - 11/03/15 1006    PT LONG TERM GOAL #1   Title Patient to demonstrate L ankle strength of 4+/5 on all planes in order to assist in improving general mechancis and regional stability    Time 6   Period Weeks   Status New   PT LONG TERM GOAL #2   Title Patient to be able to maintain SLS for at least 60 seconds with no finger tip touch or LOB in order to demonstrate readiness to return to dynamic activities    Time 6   Period Weeks   Status New   PT LONG TERM GOAL #3   Title Patient to report she has been able to perform regular walking program of at least 20 minutes, at least 3 days per week, in order to improve health habits and assist in returning to PLOF    Time 6   Period Weeks   Status New   PT LONG TERM GOAL #4   Title Patient to be able to ascend/descend full flght of stairs with no hand rail and reciprocal gait pattern, no unsteadiness and good eccentric control, in order to improve safety at home and in community and return to PLOF    Time 6   Period Weeks   Status New               Plan - 11/03/15 0950    Clinical Impression Statement Patient presents status post avulsion fracture of ankle which occured last December; she reports that she is now full weight bearing in her brace and has been working on a couple of ankle ROM exercises on her own at home, as well as on reciprocal stair climbing. Upon examination, patient demonstrates L ankle weakness and stiffness , impaired posture and gait with reduced weight bearing L LE, reduced ankle stability, difficulty maintaining SLS and  unsteadiness, and reduced functional task performance at home and in community. Patient does appear to be starting skilled PT services from a good baseline as she does not demonstrate as much ankle stiffness sa would necessarily be expected. Recommend skilled PT services in order to address functional limitations and assist patient in reaching optimal level of function.     Pt will benefit from skilled therapeutic intervention in order to improve on the following deficits Abnormal gait;Hypomobility;Increased edema;Decreased activity tolerance;Decreased strength;Pain;Decreased balance;Difficulty walking;Postural dysfunction   Rehab Potential Excellent   PT Frequency 2x / week   PT Duration 6 weeks   PT Treatment/Interventions ADLs/Self Care Home Management;Cryotherapy;Gait training;Stair training;Functional mobility training;Therapeutic activities;Therapeutic exercise;Balance training;Neuromuscular re-education;Patient/family education;Manual techniques;Taping   PT Next Visit Plan review HEP and goals; ankle ROM and balance, gait and stair training. Manual PRN.    PT Home Exercise Plan given, update regularly please    Consulted and Agree with Plan of Care Patient         Problem List Patient Active Problem List   Diagnosis Date Noted  . Fracture of fifth metatarsal bone of left foot 09/07/2015    Nedra Hai PT, DPT [phone]  [facility] [address] [place], [place], [address] Phone: [phone]   Fax:  [phone]  Name: BRYONNA SUNDBY MRN: [id number] Date of Birth: Oct 22, 1965

## 2015-11-03 NOTE — Patient Instructions (Signed)
   ANKLE CIRCLES  Move your ankle in a circular pattern one direction for several repetitions and then reverse the direction. Repeat 10-15 times clockwise and counterclockwise, 2-3 times per day.    ANKLE ABC's   While in a seated position, write out the alphabet in the air with your big toe.  Your ankle should be moving as you perform this; try not to move your lower leg or hip while you do it.   Repeat 2 times, 2-3 times per day.

## 2015-11-06 ENCOUNTER — Ambulatory Visit (HOSPITAL_COMMUNITY): Payer: PRIVATE HEALTH INSURANCE | Attending: Internal Medicine

## 2015-11-06 DIAGNOSIS — R2681 Unsteadiness on feet: Secondary | ICD-10-CM

## 2015-11-06 DIAGNOSIS — M25672 Stiffness of left ankle, not elsewhere classified: Secondary | ICD-10-CM | POA: Diagnosis not present

## 2015-11-06 DIAGNOSIS — R29898 Other symptoms and signs involving the musculoskeletal system: Secondary | ICD-10-CM

## 2015-11-06 DIAGNOSIS — R262 Difficulty in walking, not elsewhere classified: Secondary | ICD-10-CM | POA: Diagnosis not present

## 2015-11-06 DIAGNOSIS — M259 Joint disorder, unspecified: Secondary | ICD-10-CM | POA: Diagnosis not present

## 2015-11-06 DIAGNOSIS — M25572 Pain in left ankle and joints of left foot: Secondary | ICD-10-CM | POA: Diagnosis not present

## 2015-11-06 DIAGNOSIS — Z8781 Personal history of (healed) traumatic fracture: Secondary | ICD-10-CM | POA: Diagnosis not present

## 2015-11-06 NOTE — Therapy (Signed)
[facility] [address] [place], [place], [address] Phone: [phone]   Fax:  [phone]  Physical Therapy Treatment  Patient Details  Name: Kayla Meyer MRN: [id number] Date of Birth: 03-16-1966 Referring Provider: Jodi Geralds   Encounter Date: 11/06/2015      PT End of Session - 11/06/15 0851    Visit Number 2   Number of Visits 12   Date for PT Re-Evaluation 12/01/15   Authorization Type Worker's Comp/UMR (primary is worker's comp and has limit of 12 sessions approved)   Authorization Time Period 11/03/15 to 01/01/16   Authorization - Visit Number 2   Authorization - Number of Visits 12   PT Start Time 0847   PT Stop Time 0933   PT Time Calculation (min) 46 min   Activity Tolerance Patient tolerated treatment well   Behavior During Therapy [facility] for tasks assessed/performed      Past Medical History  Diagnosis Date  . Migraine   . Tachycardia     Past Surgical History  Procedure Laterality Date  . Abdominal hysterectomy    . Cervical ablation    . Tubal ligation      There were no vitals filed for this visit.  Visit Diagnosis:  History of ankle fracture  Ankle stiffness, left  Ankle weakness  Difficulty walking  Pain in joint, ankle and foot, left  Unsteadiness      Subjective Assessment - 11/06/15 0848    Subjective Pt reports compliance with HEP without questions, reports ankle mainly sore today.  No reports of pain today.   Pertinent History Hx of tachycardia, no other significant PMH in chart or patient reported. Reports she is now FWB with shoe. Hx 5th metatarsal and lateral fib fracture per patient.    Patient Stated Goals get walking better, get regular gait pattern back, get better at stairs    Currently in Pain? No/denies   Pain Descriptors / Indicators Sore               [facility] Adult PT Treatment/Exercise - 11/06/15 0001    Exercises   Exercises Ankle   Manual Therapy   Manual  Therapy Edema management   Manual therapy comments End of session separate from other treat,emt; supine position LE elevation   Edema Management Retro massage for edema control   Ankle Exercises: Stretches   Slant Board Stretch 3 reps;30 seconds   Ankle Exercises: Standing   BAPS Standing;Level 2;10 reps  all directions   SLS Rt 23", Lt 14" max of 3   Rocker Board 2 minutes  R/L and A/P   Other Standing Ankle Exercises Gait training for heel to toe pattern cueing for equal stance phase x 300 feet   Ankle Exercises: Seated   ABC's 2 reps  HEP   ABC's Limitations HEP   Ankle Circles/Pumps AROM;Left;15 reps   Ankle Circles/Pumps Limitations HEP             PT Short Term Goals - 11/03/15 1002    PT SHORT TERM GOAL #1   Title Patient will demonstrate L ankle ROM equal to that of R ankle on all planes in order to assist in reducing pain and improving general mechanics and mobility    Time 3   Period Weeks   Status New   PT SHORT TERM GOAL #2   Title Patient to demonstrate reduced edema as evidenced by zero indentations from ankle brace in order to demosntrate general improvement  of condition and to assist in improving ROM    Time 3   Period Weeks   Status New   PT SHORT TERM GOAL #3   Title Patient to demonstrate improved gait mechanics including equal weight bearing and step lengths, improved push off, improved L ankle dorsiflexion, and improved gait spped to assist in safe returnt o community based activities    Time 3   Period Weeks   Status New   PT SHORT TERM GOAL #4   Title Patient to be independent in correctly and consistently performing appropriate HEP, to be updated PRN    Time 3   Period Weeks   Status New           PT Long Term Goals - 11/03/15 1006    PT LONG TERM GOAL #1   Title Patient to demonstrate L ankle strength of 4+/5 on all planes in order to assist in improving general mechancis and regional stability    Time 6   Period Weeks   Status New    PT LONG TERM GOAL #2   Title Patient to be able to maintain SLS for at least 60 seconds with no finger tip touch or LOB in order to demonstrate readiness to return to dynamic activities    Time 6   Period Weeks   Status New   PT LONG TERM GOAL #3   Title Patient to report she has been able to perform regular walking program of at least 20 minutes, at least 3 days per week, in order to improve health habits and assist in returning to PLOF    Time 6   Period Weeks   Status New   PT LONG TERM GOAL #4   Title Patient to be able to ascend/descend full flght of stairs with no hand rail and reciprocal gait pattern, no unsteadiness and good eccentric control, in order to improve safety at home and in community and return to PLOF    Time 6   Period Weeks   Status New               Plan - 11/06/15 0935    Clinical Impression Statement Reviewed goals, compliance and technique with HEP and copy of eval given to pt.  Session focus on improving ankle mobilty and improving gait mechanics.  Cueing required for equal stance phase and heel to toe gait pattern to improve mechanics, began rocker board to improve weight distribution with gait.  Ended session with manual techniques for edema control, minimal swelling around ankle, majority or edema in gastroc/soleus complex with noted tightness.  Pt wil benefit with soft tissue mobilization techniques to gastroc/soleus complex.  No reports of pain through session   PT Next Visit Plan Continue with current PT POC to improve ankle ROM and balance, gait mechanics and stair training.  Manual PRN.  Next session increase to L3 BAPS for mobility, continue stretches and begin heel and toe raises for strengthening to improve gait mechanics.  Assess edema and complete soft tissue mobilization to gastroc/soleus complex.        Problem List Patient Active Problem List   Diagnosis Date Noted  . Fracture of fifth metatarsal bone of left foot 09/07/2015   Becky Sax, LPTA; CBIS [phone]  Juel Burrow 11/06/2015, 10:00 AM  [facility] [address] [place], [place], [address] Phone: [phone]   Fax:  [phone]  Name: ARONDA BURFORD MRN: [id number] Date of Birth:  10/01/1965     

## 2015-11-09 ENCOUNTER — Telehealth (HOSPITAL_COMMUNITY): Payer: Self-pay | Admitting: Physical Therapy

## 2015-11-09 ENCOUNTER — Ambulatory Visit (HOSPITAL_COMMUNITY): Payer: PRIVATE HEALTH INSURANCE

## 2015-11-09 DIAGNOSIS — M25572 Pain in left ankle and joints of left foot: Secondary | ICD-10-CM

## 2015-11-09 DIAGNOSIS — Z8781 Personal history of (healed) traumatic fracture: Secondary | ICD-10-CM | POA: Diagnosis not present

## 2015-11-09 DIAGNOSIS — R262 Difficulty in walking, not elsewhere classified: Secondary | ICD-10-CM

## 2015-11-09 DIAGNOSIS — M25672 Stiffness of left ankle, not elsewhere classified: Secondary | ICD-10-CM | POA: Diagnosis not present

## 2015-11-09 DIAGNOSIS — M259 Joint disorder, unspecified: Secondary | ICD-10-CM | POA: Diagnosis not present

## 2015-11-09 DIAGNOSIS — R2681 Unsteadiness on feet: Secondary | ICD-10-CM | POA: Diagnosis not present

## 2015-11-09 DIAGNOSIS — R29898 Other symptoms and signs involving the musculoskeletal system: Secondary | ICD-10-CM

## 2015-11-09 NOTE — Telephone Encounter (Signed)
Called the office of patient's referring MD and asked if patient can come out of brace and get into normal tennis shoes for weight bearing activities with PT. Staff reports that MD is out today and tomorrow but they will return our call with an answer later this week.  Nedra Hai PT, DPT [phone]

## 2015-11-09 NOTE — Therapy (Signed)
[place] [address] [place], [place], [address] Phone: [phone]   Fax:  [phone]  Physical Therapy Treatment  Patient Details  Name: Kayla Meyer MRN: [id number] Date of Birth: 1966-06-19 Referring Provider: Jodi Geralds   Encounter Date: 11/09/2015      PT End of Session - 11/09/15 1025    Visit Number 3   Number of Visits 12   Date for PT Re-Evaluation 12/01/15   Authorization Type Worker's Comp/UMR (primary is worker's comp and has limit of 12 sessions approved)   Authorization Time Period 11/03/15 to 01/01/16   Authorization - Visit Number 3   Authorization - Number of Visits 12   PT Start Time 1016   PT Stop Time 1103   PT Time Calculation (min) 47 min   Activity Tolerance Patient tolerated treatment well   Behavior During Therapy [facility] for tasks assessed/performed      Past Medical History  Diagnosis Date  . Migraine   . Tachycardia     Past Surgical History  Procedure Laterality Date  . Abdominal hysterectomy    . Cervical ablation    . Tubal ligation      There were no vitals filed for this visit.  Visit Diagnosis:  History of ankle fracture  Ankle stiffness, left  Ankle weakness  Difficulty walking  Pain in joint, ankle and foot, left  Unsteadiness      Subjective Assessment - 11/09/15 1020    Subjective Pt reports she is a little sore today, no reports of pain through session.     Pertinent History Hx of tachycardia, no other significant PMH in chart or patient reported. Reports she is now FWB with shoe. Hx 5th metatarsal and lateral fib fracture per patient.    Patient Stated Goals get walking better, get regular gait pattern back, get better at stairs    Currently in Pain? No/denies   Pain Descriptors / Indicators Sore              [facility] Adult PT Treatment/Exercise - 11/09/15 0001    Manual Therapy   Manual Therapy Soft tissue mobilization;Edema management   Manual therapy  comments End of session separate from other treatmemt; supine position LE elevation for retro massage and prone for STM for gastroc/soleus complex    Ankle Exercises: Standing   BAPS Standing;Level 3;10 reps  all directions   SLS Rt 43", Lt 36: max of 3   Rocker Board 2 minutes  R/L and A/P   Heel Raises 10 reps   Toe Raise 10 reps   Other Standing Ankle Exercises Gait training for heel to toe pattern cueing for equal stance phase x 226 feet   Ankle Exercises: Stretches   Slant Board Stretch 3 reps;30 seconds            PT Short Term Goals - 11/03/15 1002    PT SHORT TERM GOAL #1   Title Patient will demonstrate L ankle ROM equal to that of R ankle on all planes in order to assist in reducing pain and improving general mechanics and mobility    Time 3   Period Weeks   Status New   PT SHORT TERM GOAL #2   Title Patient to demonstrate reduced edema as evidenced by zero indentations from ankle brace in order to demosntrate general improvement of condition and to assist in improving ROM    Time 3   Period Weeks   Status New   PT  SHORT TERM GOAL #3   Title Patient to demonstrate improved gait mechanics including equal weight bearing and step lengths, improved push off, improved L ankle dorsiflexion, and improved gait spped to assist in safe returnt o community based activities    Time 3   Period Weeks   Status New   PT SHORT TERM GOAL #4   Title Patient to be independent in correctly and consistently performing appropriate HEP, to be updated PRN    Time 3   Period Weeks   Status New           PT Long Term Goals - 11/03/15 1006    PT LONG TERM GOAL #1   Title Patient to demonstrate L ankle strength of 4+/5 on all planes in order to assist in improving general mechancis and regional stability    Time 6   Period Weeks   Status New   PT LONG TERM GOAL #2   Title Patient to be able to maintain SLS for at least 60 seconds with no finger tip touch or LOB in order to  demonstrate readiness to return to dynamic activities    Time 6   Period Weeks   Status New   PT LONG TERM GOAL #3   Title Patient to report she has been able to perform regular walking program of at least 20 minutes, at least 3 days per week, in order to improve health habits and assist in returning to PLOF    Time 6   Period Weeks   Status New   PT LONG TERM GOAL #4   Title Patient to be able to ascend/descend full flght of stairs with no hand rail and reciprocal gait pattern, no unsteadiness and good eccentric control, in order to improve safety at home and in community and return to PLOF    Time 6   Period Weeks   Status New               Plan - 11/09/15 1324    Clinical Impression Statement Continued session focus on improving ankle mobility and progressed strengthening with heel and toe raises to improve gait mechanics.  Able to increase height with BAPS board to improve ankle mobilty without c/o increased pain.  Pt improve gait mechanics with heel to toe, increased difficulty with good roll to toe in boot requiring cueing to improve equal stance phase.  Discussion held with evaluation therapist about pt. brining tennis shoes to improve gait mechanics.  Evaluation PT called MD about therapy out of boot.  End of sessoin manual soft tissue mobilization techniques complete to reduce gastroc.soleus completx tightness.  No reports of pain at end of session, did report some soreness. Encouraged pt to continue mobiility exercises and ice for pain and edema control.     PT Next Visit Plan Continue with current PT POC to improve ankle ROM and balance, gait mechanics and stair training.  Manual PRN.  Assess edema and complete soft tissue mobilization to gastroc/soleus complex.        Problem List Patient Active Problem List   Diagnosis Date Noted  . Fracture of fifth metatarsal bone of left foot 09/07/2015   Becky Sax, LPTA; CBIS [phone]  Juel Burrow 11/09/2015,  5:07 PM  [facility] [address] [place], [place], [address] Phone: [phone]   Fax:  [phone]  Name: Kayla Meyer MRN: [id number] Date of Birth: Feb 09, 1966

## 2015-11-13 ENCOUNTER — Ambulatory Visit (HOSPITAL_COMMUNITY): Payer: PRIVATE HEALTH INSURANCE | Admitting: Physical Therapy

## 2015-11-13 DIAGNOSIS — Z8781 Personal history of (healed) traumatic fracture: Secondary | ICD-10-CM | POA: Diagnosis not present

## 2015-11-13 DIAGNOSIS — M25672 Stiffness of left ankle, not elsewhere classified: Secondary | ICD-10-CM

## 2015-11-13 DIAGNOSIS — M25572 Pain in left ankle and joints of left foot: Secondary | ICD-10-CM

## 2015-11-13 DIAGNOSIS — R262 Difficulty in walking, not elsewhere classified: Secondary | ICD-10-CM | POA: Diagnosis not present

## 2015-11-13 DIAGNOSIS — R2681 Unsteadiness on feet: Secondary | ICD-10-CM

## 2015-11-13 DIAGNOSIS — R29898 Other symptoms and signs involving the musculoskeletal system: Secondary | ICD-10-CM

## 2015-11-13 DIAGNOSIS — M259 Joint disorder, unspecified: Secondary | ICD-10-CM | POA: Diagnosis not present

## 2015-11-13 NOTE — Therapy (Signed)
[facility] [address] [place], [place], [address] Phone: [phone]   Fax:  [phone]  Physical Therapy Treatment  Patient Details  Name: Kayla Meyer MRN: [id number] Date of Birth: [birth year] Referring Provider: Jodi Geralds   Encounter Date: 11/13/2015      PT End of Session - 11/13/15 1036    Visit Number 4   Number of Visits 12   Date for PT Re-Evaluation 12/01/15   Authorization Type Worker's Comp/UMR (primary is worker's comp and has limit of 12 sessions approved)   Authorization Time Period 11/03/15 to 01/01/16   Authorization - Visit Number 4   Authorization - Number of Visits 12   PT Start Time 0852   PT Stop Time 0950   PT Time Calculation (min) 58 min   Activity Tolerance Patient tolerated treatment well   Behavior During Therapy [facility] for tasks assessed/performed      Past Medical History  Diagnosis Date  . Migraine   . Tachycardia     Past Surgical History  Procedure Laterality Date  . Abdominal hysterectomy    . Cervical ablation    . Tubal ligation      There were no vitals filed for this visit.  Visit Diagnosis:  History of ankle fracture  Ankle stiffness, left  Ankle weakness  Difficulty walking  Pain in joint, ankle and foot, left  Unsteadiness      Subjective Assessment - 11/13/15 0902    Subjective Pt states she is just getting off night shift but sat most of the night.  Reports soreness but no real pain this morning.    Currently in Pain? No/denies                         [facility] Adult PT Treatment/Exercise - 11/13/15 0001    Manual Therapy   Manual Therapy Soft tissue mobilization;Edema management   Manual therapy comments End of session separate from other treatmemt;  prone for STM for gastroc/soleus complex with elevation on wedge    Edema Management Retro massage for edema control   Ankle Exercises: Standing   BAPS Standing;Level 3;10 reps   SLS Rt 28", Lt  18: max of 3   Rocker Board 2 minutes   Heel Raises 15 reps   Toe Raise 15 reps   Heel Walk (Round Trip) 1 RT   Toe Walk (Round Trip) 1 RT   Other Standing Ankle Exercises Gait training for heel to toe pattern cueing for equal stance phase x 226 feet   Ankle Exercises: Stretches   Slant Board Stretch 3 reps;30 seconds                  PT Short Term Goals - 11/03/15 1002    PT SHORT TERM GOAL #1   Title Patient will demonstrate L ankle ROM equal to that of R ankle on all planes in order to assist in reducing pain and improving general mechanics and mobility    Time 3   Period Weeks   Status New   PT SHORT TERM GOAL #2   Title Patient to demonstrate reduced edema as evidenced by zero indentations from ankle brace in order to demosntrate general improvement of condition and to assist in improving ROM    Time 3   Period Weeks   Status New   PT SHORT TERM GOAL #3   Title Patient to demonstrate improved gait mechanics including equal weight bearing  and step lengths, improved push off, improved L ankle dorsiflexion, and improved gait spped to assist in safe returnt o community based activities    Time 3   Period Weeks   Status New   PT SHORT TERM GOAL #4   Title Patient to be independent in correctly and consistently performing appropriate HEP, to be updated PRN    Time 3   Period Weeks   Status New           PT Long Term Goals - 11/03/15 1006    PT LONG TERM GOAL #1   Title Patient to demonstrate L ankle strength of 4+/5 on all planes in order to assist in improving general mechancis and regional stability    Time 6   Period Weeks   Status New   PT LONG TERM GOAL #2   Title Patient to be able to maintain SLS for at least 60 seconds with no finger tip touch or LOB in order to demonstrate readiness to return to dynamic activities    Time 6   Period Weeks   Status New   PT LONG TERM GOAL #3   Title Patient to report she has been able to perform regular walking  program of at least 20 minutes, at least 3 days per week, in order to improve health habits and assist in returning to PLOF    Time 6   Period Weeks   Status New   PT LONG TERM GOAL #4   Title Patient to be able to ascend/descend full flght of stairs with no hand rail and reciprocal gait pattern, no unsteadiness and good eccentric control, in order to improve safety at home and in community and return to PLOF    Time 6   Period Weeks   Status New               Plan - 11/13/15 1037    Clinical Impression Statement Focus on improving ankle stability today with progression to heel and toe walks and continued work with established therex.   Pt unable to maintain SLS as long today due to fatigue from just getting off night shift.  Manual completed at end of session to help decrease edema. No reply received yet from MD regarding removing cast shoe for therapy.  PT reported she would have her case manager contact her MD office and let us know at next visit.  States she will bring her tennis shoe if able to complete without cast shoe.     PT Next Visit Plan Continue with current PT POC to improve ankle ROM and balance, gait mechanics and stair training.  Manual PRN.  Assess edema and complete soft tissue mobilization to gastroc/soleus complex.        Problem List Patient Active Problem List   Diagnosis Date Noted  . Fracture of fifth metatarsal bone of left foot 09/07/2015    Lurena Nida, PTA/CLT [phone]  11/13/2015, 11:04 AM  [place] [facility] [address] [place], [place], [address] Phone: [phone]   Fax:  [phone]  Name: Kayla Meyer MRN: [id number] Date of Birth: June 25, 1966

## 2015-11-17 ENCOUNTER — Ambulatory Visit (HOSPITAL_COMMUNITY): Payer: PRIVATE HEALTH INSURANCE

## 2015-11-17 DIAGNOSIS — M25572 Pain in left ankle and joints of left foot: Secondary | ICD-10-CM | POA: Diagnosis not present

## 2015-11-17 DIAGNOSIS — R2681 Unsteadiness on feet: Secondary | ICD-10-CM | POA: Diagnosis not present

## 2015-11-17 DIAGNOSIS — Z8781 Personal history of (healed) traumatic fracture: Secondary | ICD-10-CM

## 2015-11-17 DIAGNOSIS — R262 Difficulty in walking, not elsewhere classified: Secondary | ICD-10-CM

## 2015-11-17 DIAGNOSIS — M259 Joint disorder, unspecified: Secondary | ICD-10-CM | POA: Diagnosis not present

## 2015-11-17 DIAGNOSIS — M25672 Stiffness of left ankle, not elsewhere classified: Secondary | ICD-10-CM

## 2015-11-17 DIAGNOSIS — R29898 Other symptoms and signs involving the musculoskeletal system: Secondary | ICD-10-CM

## 2015-11-17 NOTE — Therapy (Signed)
[facility] [address] [place], [place], [address] Phone: [phone]   Fax:  [phone]  Physical Therapy Treatment  Patient Details  Name: Kayla Meyer MRN: [id number] Date of Birth: [birth year] Referring Provider: Jodi Geralds   Encounter Date: 11/17/2015      PT End of Session - 11/17/15 1036    Visit Number 5   Number of Visits 12   Date for PT Re-Evaluation 12/01/15   Authorization Type Worker's Comp/UMR (primary is worker's comp and has limit of 12 sessions approved)   Authorization Time Period 11/03/15 to 01/01/16   Authorization - Visit Number 5   Authorization - Number of Visits 12   PT Start Time 0853   PT Stop Time 0938   PT Time Calculation (min) 45 min   Activity Tolerance Patient tolerated treatment well   Behavior During Therapy [facility] for tasks assessed/performed      Past Medical History  Diagnosis Date  . Migraine   . Tachycardia     Past Surgical History  Procedure Laterality Date  . Abdominal hysterectomy    . Cervical ablation    . Tubal ligation      There were no vitals filed for this visit.  Visit Diagnosis:  History of ankle fracture  Ankle stiffness, left  Ankle weakness  Difficulty walking  Pain in joint, ankle and foot, left  Unsteadiness      Subjective Assessment - 11/17/15 0858    Subjective Pt stated she was a little sore today, no reports of pain today.  Reports compliance with HEP daily.   Pertinent History Hx of tachycardia, no other significant PMH in chart or patient reported. Reports she is now FWB with shoe. Hx 5th metatarsal and lateral fib fracture per patient.    Patient Stated Goals get walking better, get regular gait pattern back, get better at stairs    Currently in Pain? No/denies   Pain Location Foot   Pain Descriptors / Indicators Sore   Pain Onset More than a month ago   Pain Frequency Intermittent   Aggravating Factors  walking and standing   Pain  Relieving Factors brace, RICE            [facility] Adult PT Treatment/Exercise - 11/17/15 0001    Exercises   Exercises Ankle;Knee/Hip   Knee/Hip Exercises: Standing   Lateral Step Up 15 reps;Hand Hold: 1;Step Height: 4"   Forward Step Up 15 reps;Hand Hold: 1;Step Height: 4"   Manual Therapy   Manual Therapy Soft tissue mobilization;Edema management   Manual therapy comments End of session separate from other treatmemt;  prone for STM for gastroc/soleus complex with elevation on wedge    Edema Management Retro massage for edema control   Soft tissue mobilization gastroc/soleus complex prone position   Ankle Exercises: Standing   BAPS Standing;Level 3;10 reps   SLS Rt 60", Lt 39" max of 3   Rocker Board 2 minutes  A/P and R/L   Heel Raises 15 reps   Toe Raise 15 reps   Heel Walk (Round Trip) 1 RT   Toe Walk (Round Trip) 1 RT   Ankle Exercises: Stretches   Slant Board Stretch 3 reps;30 seconds            PT Short Term Goals - 11/03/15 1002    PT SHORT TERM GOAL #1   Title Patient will demonstrate L ankle ROM equal to that of R ankle on all planes in order to  assist in reducing pain and improving general mechanics and mobility    Time 3   Period Weeks   Status New   PT SHORT TERM GOAL #2   Title Patient to demonstrate reduced edema as evidenced by zero indentations from ankle brace in order to demosntrate general improvement of condition and to assist in improving ROM    Time 3   Period Weeks   Status New   PT SHORT TERM GOAL #3   Title Patient to demonstrate improved gait mechanics including equal weight bearing and step lengths, improved push off, improved L ankle dorsiflexion, and improved gait spped to assist in safe returnt o community based activities    Time 3   Period Weeks   Status New   PT SHORT TERM GOAL #4   Title Patient to be independent in correctly and consistently performing appropriate HEP, to be updated PRN    Time 3   Period Weeks   Status New            PT Long Term Goals - 11/03/15 1006    PT LONG TERM GOAL #1   Title Patient to demonstrate L ankle strength of 4+/5 on all planes in order to assist in improving general mechancis and regional stability    Time 6   Period Weeks   Status New   PT LONG TERM GOAL #2   Title Patient to be able to maintain SLS for at least 60 seconds with no finger tip touch or LOB in order to demonstrate readiness to return to dynamic activities    Time 6   Period Weeks   Status New   PT LONG TERM GOAL #3   Title Patient to report she has been able to perform regular walking program of at least 20 minutes, at least 3 days per week, in order to improve health habits and assist in returning to PLOF    Time 6   Period Weeks   Status New   PT LONG TERM GOAL #4   Title Patient to be able to ascend/descend full flght of stairs with no hand rail and reciprocal gait pattern, no unsteadiness and good eccentric control, in order to improve safety at home and in community and return to PLOF    Time 6   Period Weeks   Status New               Plan - 11/17/15 1039    Clinical Impression Statement Session focus on improving ankle stability and progressed functional strengthening this session.  Balance is improving with increased SLS time Bil LE.  Progressed to stair training for functional strengthening, pt able to demonstrate appropriate form and technique with all exercises.  Ended session with manual soft tissue mobilization and retro massage to reduce tightness gastroc/soleus complex and reduce edema for pain control and to improve gait mechanics  Pt stated slight increased on lateral malleoli during BAPS exercise, pain resolved following manual.     PT Next Visit Plan Continue with current PT POC to improve ankle ROM and balance, gait mechanics and stair training.  Manual PRN.  Assess edema and complete soft tissue mobilization to gastroc/soleus complex.        Problem List Patient Active  Problem List   Diagnosis Date Noted  . Fracture of fifth metatarsal bone of left foot 09/07/2015   Becky Sax, LPTA; CBIS [phone]  Juel Burrow 11/17/2015, 12:55 PM  [facility] [address] [place], [place], [address] Phone: [phone]   Fax:  [phone]  Name: Kayla Meyer MRN: [id number] Date of Birth: Mar 05, 1966

## 2015-11-19 ENCOUNTER — Ambulatory Visit (HOSPITAL_COMMUNITY): Payer: PRIVATE HEALTH INSURANCE | Attending: Orthopedic Surgery | Admitting: Physical Therapy

## 2015-11-19 DIAGNOSIS — Z8781 Personal history of (healed) traumatic fracture: Secondary | ICD-10-CM | POA: Diagnosis not present

## 2015-11-19 DIAGNOSIS — M25572 Pain in left ankle and joints of left foot: Secondary | ICD-10-CM | POA: Diagnosis not present

## 2015-11-19 DIAGNOSIS — R262 Difficulty in walking, not elsewhere classified: Secondary | ICD-10-CM

## 2015-11-19 DIAGNOSIS — M25672 Stiffness of left ankle, not elsewhere classified: Secondary | ICD-10-CM | POA: Diagnosis not present

## 2015-11-19 DIAGNOSIS — R29898 Other symptoms and signs involving the musculoskeletal system: Secondary | ICD-10-CM

## 2015-11-19 DIAGNOSIS — M259 Joint disorder, unspecified: Secondary | ICD-10-CM | POA: Insufficient documentation

## 2015-11-19 DIAGNOSIS — R2681 Unsteadiness on feet: Secondary | ICD-10-CM | POA: Diagnosis not present

## 2015-11-19 NOTE — Therapy (Signed)
[place] [facility] [address] [place], [place], [address] Phone: [phone]   Fax:  [phone]  Physical Therapy Treatment  Patient Details  Name: Kayla Meyer MRN: [id number] Date of Birth: [birth year] Referring Provider: Jodi Geralds   Encounter Date: 11/19/2015      PT End of Session - 11/19/15 1039    Visit Number 6   Number of Visits 12   Date for PT Re-Evaluation 12/01/15   Authorization Type Worker's Comp/UMR (primary is worker's comp and has limit of 12 sessions approved)   Authorization Time Period 11/03/15 to 01/01/16   Authorization - Visit Number 6   Authorization - Number of Visits 12   PT Start Time 0843   PT Stop Time 0930   PT Time Calculation (min) 47 min   Activity Tolerance Patient tolerated treatment well;No increased pain   Behavior During Therapy [facility] for tasks assessed/performed      Past Medical History  Diagnosis Date  . Migraine   . Tachycardia     Past Surgical History  Procedure Laterality Date  . Abdominal hysterectomy    . Cervical ablation    . Tubal ligation      There were no vitals filed for this visit.  Visit Diagnosis:  History of ankle fracture  Ankle stiffness, left  Ankle weakness  Difficulty walking  Unsteadiness      Subjective Assessment - 11/19/15 0845    Subjective Just got off work. Get's to get out of post-op shoe today and wear tennis shoes today. Had alot of cramping in the calves after last session.    Pertinent History Hx of tachycardia, no other significant PMH in chart or patient reported. Hx 5th metatarsal and lateral fib fracture per patient. Pt reports being cleared to wear tennis shoes now.    Currently in Pain? No/denies   Aggravating Factors  going up on toes and walking on the heels.    Pain Relieving Factors gentle active motion, ibuprofen as needed.                          [facility] Adult PT Treatment/Exercise - 11/19/15 0001    Ambulation/Gait   Gait Comments encouraging Lt plantarflexion for pushoff    Knee/Hip Exercises: Aerobic   Nustep L2 X   Knee/Hip Exercises: Standing   Lateral Step Up 15 reps;Hand Hold: 1;Step Height: 4"   Forward Step Up 15 reps;Hand Hold: 1;Step Height: 4"   Ankle Exercises: Standing   BAPS Standing;Level 3;10 reps   Rocker Board 2 minutes  ant/post and Rt/Lt   Heel Raises 15 reps   Toe Raise 15 reps   Heel Walk (Round Trip) 2 lenghts on parallel bars   Toe Walk (Round Trip) 2 lenghts on parallel bars   Other Standing Ankle Exercises SLS with forward/lateral/backward toe taps.   Ankle Exercises: Stretches   Slant Board Stretch 3 reps;30 seconds                PT Education - 11/19/15 1039    Education provided Yes   Education Details plantar flexion on Lt for pushoff with gait.   Person(s) Educated Patient   Methods Explanation;Demonstration;Verbal cues   Comprehension Verbalized understanding          PT Short Term Goals - 11/03/15 1002    PT SHORT TERM GOAL #1   Title Patient will demonstrate L ankle ROM equal to that of R ankle  on all planes in order to assist in reducing pain and improving general mechanics and mobility    Time 3   Period Weeks   Status New   PT SHORT TERM GOAL #2   Title Patient to demonstrate reduced edema as evidenced by zero indentations from ankle brace in order to demosntrate general improvement of condition and to assist in improving ROM    Time 3   Period Weeks   Status New   PT SHORT TERM GOAL #3   Title Patient to demonstrate improved gait mechanics including equal weight bearing and step lengths, improved push off, improved L ankle dorsiflexion, and improved gait spped to assist in safe returnt o community based activities    Time 3   Period Weeks   Status New   PT SHORT TERM GOAL #4   Title Patient to be independent in correctly and consistently performing appropriate HEP, to be updated PRN    Time 3   Period Weeks    Status New           PT Long Term Goals - 11/03/15 1006    PT LONG TERM GOAL #1   Title Patient to demonstrate L ankle strength of 4+/5 on all planes in order to assist in improving general mechancis and regional stability    Time 6   Period Weeks   Status New   PT LONG TERM GOAL #2   Title Patient to be able to maintain SLS for at least 60 seconds with no finger tip touch or LOB in order to demonstrate readiness to return to dynamic activities    Time 6   Period Weeks   Status New   PT LONG TERM GOAL #3   Title Patient to report she has been able to perform regular walking program of at least 20 minutes, at least 3 days per week, in order to improve health habits and assist in returning to PLOF    Time 6   Period Weeks   Status New   PT LONG TERM GOAL #4   Title Patient to be able to ascend/descend full flght of stairs with no hand rail and reciprocal gait pattern, no unsteadiness and good eccentric control, in order to improve safety at home and in community and return to PLOF    Time 6   Period Weeks   Status New               Plan - 11/19/15 1040    Clinical Impression Statement Patient reporting that she has been cleared to use regular tennis shoes as of today. During session the patient demonstrates difficulty with Lt single leg activities. Attempting to incorporate both static and controlled dynamic balance activities. Patient declined MT today, preferring to focus on strength and balance.   PT Next Visit Plan Encourage plantarflexion on Lt with gait for even pushoff. Work on progression from static to gradually more dynamic balance activites. Consider SLS/split stance with UE/LE motion for increased challenge.    PT Home Exercise Plan review as needed.    Consulted and Agree with Plan of Care Patient        Problem List Patient Active Problem List   Diagnosis Date Noted  . Fracture of fifth metatarsal bone of left foot 09/07/2015    Christiane Ha, PT,  CSCS Pager [phone]  11/19/2015, 10:45 AM  [facility] [address] [place], [place], [address]  Phone: [phone]   Fax:  [phone]  Name: Kayla Meyer MRN: [id number] Date of Birth: 05/22/1966

## 2015-11-23 ENCOUNTER — Ambulatory Visit (HOSPITAL_COMMUNITY): Payer: PRIVATE HEALTH INSURANCE | Admitting: Physical Therapy

## 2015-11-23 DIAGNOSIS — M259 Joint disorder, unspecified: Secondary | ICD-10-CM | POA: Diagnosis not present

## 2015-11-23 DIAGNOSIS — M25672 Stiffness of left ankle, not elsewhere classified: Secondary | ICD-10-CM | POA: Diagnosis not present

## 2015-11-23 DIAGNOSIS — R2681 Unsteadiness on feet: Secondary | ICD-10-CM

## 2015-11-23 DIAGNOSIS — M25572 Pain in left ankle and joints of left foot: Secondary | ICD-10-CM | POA: Diagnosis not present

## 2015-11-23 DIAGNOSIS — R262 Difficulty in walking, not elsewhere classified: Secondary | ICD-10-CM

## 2015-11-23 DIAGNOSIS — Z8781 Personal history of (healed) traumatic fracture: Secondary | ICD-10-CM

## 2015-11-23 DIAGNOSIS — R29898 Other symptoms and signs involving the musculoskeletal system: Secondary | ICD-10-CM

## 2015-11-23 NOTE — Therapy (Signed)
[place] [facility] [address] [place], [place], [address] Phone: [phone]   Fax:  [phone]  Physical Therapy Treatment  Patient Details  Name: Bedelia PersonKatherine M Huberty MRN: [id number] Date of Birth: [birth year] Referring Provider: Jodi GeraldsJohn Graves   Encounter Date: 11/23/2015      PT End of Session - 11/23/15 0905    Visit Number 7   Number of Visits 12   Date for PT Re-Evaluation 12/01/15   Authorization Type Worker's Comp/UMR (primary is worker's comp and has limit of 12 sessions approved)   Authorization Time Period 11/03/15 to 01/01/16   Authorization - Visit Number 7   Authorization - Number of Visits 12   PT Start Time 0845   PT Stop Time 0929   PT Time Calculation (min) 44 min   Activity Tolerance Patient tolerated treatment well      Past Medical History  Diagnosis Date  . Migraine   . Tachycardia     Past Surgical History  Procedure Laterality Date  . Abdominal hysterectomy    . Cervical ablation    . Tubal ligation      There were no vitals filed for this visit.  Visit Diagnosis:  History of ankle fracture  Ankle stiffness, left  Ankle weakness  Difficulty walking  Unsteadiness  Pain in joint, ankle and foot, left      Subjective Assessment - 11/23/15 0855    Subjective Pt states that overall she is feeling better.     Currently in Pain? Yes   Pain Score 1    Pain Location Ankle   Pain Orientation Left;Lateral   Pain Descriptors / Indicators Sore   Pain Type Chronic pain   Pain Onset More than a month ago   Pain Frequency Intermittent   Aggravating Factors  unsure    Pain Relieving Factors ibuprofen                          [facility] Adult PT Treatment/Exercise - 11/23/15 0001    Ambulation/Gait   Gait Comments encouraging Lt plantarflexion for pushoff    Balance Poses: Yoga   Tree Pose 2 reps;30 seconds   Exercises   Exercises Ankle   Knee/Hip Exercises: Aerobic   Nustep --   Knee/Hip  Exercises: Machines for Strengthening   Other Machine Ankle plantarflexion 3 Pl x 15    Knee/Hip Exercises: Standing   Heel Raises Both;15 reps   Lateral Step Up 15 reps;Hand Hold: 1;Step Height: 4"   Forward Step Up 15 reps;Hand Hold: 1;Step Height: 4"   SLS 60 seconds    SLS with Vectors 2 15"   Ankle Exercises: Stretches   Slant Board Stretch 3 reps;30 seconds   Ankle Exercises: Standing   BAPS Standing;Level 3;15 reps   Rocker Board 2 minutes  ant/post and Rt/Lt   Heel Raises 15 reps;Limitations   Heel Raises Limitations no hands    Toe Raise 15 reps   Toe Raise Limitations no hands    Heel Walk (Round Trip) 2 lenghts on parallel bars   Toe Walk (Round Trip) 2 lenghts on parallel bars   Other Standing Ankle Exercises split stance squats x 10 each    Ankle Exercises: Supine   T-Band all x 5                 PT Education - 11/23/15 [id number]    Education provided Yes   Education Details HEP  Person(s) Educated Patient   Methods Explanation;Handout;Demonstration;Verbal cues   Comprehension Verbalized understanding;Returned demonstration          PT Short Term Goals - 11/03/15 1002    PT SHORT TERM GOAL #1   Title Patient will demonstrate L ankle ROM equal to that of R ankle on all planes in order to assist in reducing pain and improving general mechanics and mobility    Time 3   Period Weeks   Status New   PT SHORT TERM GOAL #2   Title Patient to demonstrate reduced edema as evidenced by zero indentations from ankle brace in order to demosntrate general improvement of condition and to assist in improving ROM    Time 3   Period Weeks   Status New   PT SHORT TERM GOAL #3   Title Patient to demonstrate improved gait mechanics including equal weight bearing and step lengths, improved push off, improved L ankle dorsiflexion, and improved gait spped to assist in safe returnt o community based activities    Time 3   Period Weeks   Status New   PT SHORT TERM GOAL #4    Title Patient to be independent in correctly and consistently performing appropriate HEP, to be updated PRN    Time 3   Period Weeks   Status New           PT Long Term Goals - 11/03/15 1006    PT LONG TERM GOAL #1   Title Patient to demonstrate L ankle strength of 4+/5 on all planes in order to assist in improving general mechancis and regional stability    Time 6   Period Weeks   Status New   PT LONG TERM GOAL #2   Title Patient to be able to maintain SLS for at least 60 seconds with no finger tip touch or LOB in order to demonstrate readiness to return to dynamic activities    Time 6   Period Weeks   Status New   PT LONG TERM GOAL #3   Title Patient to report she has been able to perform regular walking program of at least 20 minutes, at least 3 days per week, in order to improve health habits and assist in returning to PLOF    Time 6   Period Weeks   Status New   PT LONG TERM GOAL #4   Title Patient to be able to ascend/descend full flght of stairs with no hand rail and reciprocal gait pattern, no unsteadiness and good eccentric control, in order to improve safety at home and in community and return to PLOF    Time 6   Period Weeks   Status New               Plan - 11/23/15 0906    Clinical Impression Statement Pt improving in balance advanced treatment adding tree pose, vector stances, theraband resisted exercises as well as cybex plantarflexion for strength.  Pt needs minimal cuing for proper technique of exercises but noted fatigue of muscles with theraband activity.    Rehab Potential Excellent   PT Frequency 2x / week   PT Duration 6 weeks   PT Treatment/Interventions ADLs/Self Care Home Management;Cryotherapy;Gait training;Stair training;Functional mobility training;Therapeutic activities;Therapeutic exercise;Balance training;Neuromuscular re-education;Patient/family education;Manual techniques;Taping   PT Next Visit Plan Continue to progress balance and  strengthening exercises as tolerated.  Begin balance beam activities.    Consulted and Agree with Plan of Care Patient  Problem List Patient Active Problem List   Diagnosis Date Noted  . Fracture of fifth metatarsal bone of left foot 09/07/2015   Virgina Organ, PT CLT [phone] 11/23/2015, 9:32 AM  [place] [facility] [address] [place], [place], [address] Phone: [phone]   Fax:  [phone]  Name: LOYD SALVADOR MRN: [id number] Date of Birth: 05/10/1966

## 2015-11-23 NOTE — Patient Instructions (Signed)
Dorsiflexion: Resisted    Facing anchor, tubing around left foot, pull toward face.  Repeat _15___ times per set. Do __1__ sets per session. Do __2__ sessions per day.  http://orth.exer.us/8   Copyright  VHI. All rights reserved.  Eversion: Resisted    With right foot in tubing loop, hold tubing around other foot to resist and turn foot out. Repeat __15__ times per set. Do __1__ sets per session. Do __2__ sessions per day.  http://orth.exer.us/14   Copyright  VHI. All rights reserved.  Inversion: Resisted    Cross legs with right leg underneath, foot in tubing loop. Hold tubing around other foot to resist and turn foot in. Repeat 15____ times per set. Do ___1_ sets per session. Do _2___ sessions per day.  http://orth.exer.us/12   Copyright  VHI. All rights reserved.  Plantar Flexion: Resisted    Anchor behind, tubing around left foot, press down. Repeat _15___ times per set. Do __1__ sets per session. Do _2___ sessions per day.  http://orth.exer.us/10   Copyright  VHI. All rights reserved.

## 2015-11-25 ENCOUNTER — Ambulatory Visit (HOSPITAL_COMMUNITY): Payer: PRIVATE HEALTH INSURANCE | Admitting: Physical Therapy

## 2015-11-25 DIAGNOSIS — R2681 Unsteadiness on feet: Secondary | ICD-10-CM

## 2015-11-25 DIAGNOSIS — M25672 Stiffness of left ankle, not elsewhere classified: Secondary | ICD-10-CM | POA: Diagnosis not present

## 2015-11-25 DIAGNOSIS — R262 Difficulty in walking, not elsewhere classified: Secondary | ICD-10-CM

## 2015-11-25 DIAGNOSIS — M25572 Pain in left ankle and joints of left foot: Secondary | ICD-10-CM | POA: Diagnosis not present

## 2015-11-25 DIAGNOSIS — Z8781 Personal history of (healed) traumatic fracture: Secondary | ICD-10-CM | POA: Diagnosis not present

## 2015-11-25 DIAGNOSIS — R29898 Other symptoms and signs involving the musculoskeletal system: Secondary | ICD-10-CM

## 2015-11-25 DIAGNOSIS — M259 Joint disorder, unspecified: Secondary | ICD-10-CM | POA: Diagnosis not present

## 2015-11-25 NOTE — Therapy (Signed)
[place] [address] [place], [place], [address] Phone: [phone]   Fax:  [phone]  Physical Therapy Treatment; Progress Note  Patient Details  Name: Kayla Meyer MRN: [id number] Date of Birth: 26-Dec-1965 Referring Provider: Dorna Leitz  Encounter Date: 11/25/2015      PT End of Session - 11/25/15 0918    Visit Number 8   Number of Visits 12   Date for PT Re-Evaluation 12/01/15   Authorization Type Worker's Comp/UMR (primary is worker's comp and has limit of 12 sessions approved)   Authorization Time Period 11/03/15 to 01/01/16   Authorization - Visit Number 8   Authorization - Number of Visits 12   PT Start Time 0845   PT Stop Time 0930   PT Time Calculation (min) 45 min   Activity Tolerance Patient tolerated treatment well   Behavior During Therapy [facility] for tasks assessed/performed      Past Medical History  Diagnosis Date  . Migraine   . Tachycardia     Past Surgical History  Procedure Laterality Date  . Abdominal hysterectomy    . Cervical ablation    . Tubal ligation      There were no vitals filed for this visit.  Visit Diagnosis:  History of ankle fracture  Ankle stiffness, left  Ankle weakness  Difficulty walking  Unsteadiness  Pain in joint, ankle and foot, left      Subjective Assessment - 11/25/15 1210    Subjective Pt reports no pain today.  STates it is getting better.  Returns to MD tomorrow.   Currently in Pain? No/denies            [facility] PT Assessment - 11/25/15 0001    Assessment   Medical Diagnosis hx of L ankle fracture    Referring Provider john graves   Onset Date/Surgical Date 09/01/15   Next MD Visit 11/26/15 with Dr. Berenice Primas    Restrictions   Weight Bearing Restrictions No   Observation/Other Assessments   Observations SLS B 60 seconds no UE's (was 37 seconds with intermittent fingertouch; SLS L 4 seconds)   Focus on Therapeutic Outcomes (FOTO)  38% limited (was 55%  limited)   AROM   Left Ankle Dorsiflexion 8  12 degrees AAROM (was 5 degrees AROM, 8 AAROM)   Left Ankle Plantar Flexion --  WNL   Left Ankle Inversion 35  was 18   Left Ankle Eversion 15  was 15   Strength   Right Knee Flexion 4+/5  was 4   Right Knee Extension 4+/5  was 4   Left Knee Flexion 4+/5  was 4+   Left Knee Extension 4/5  was 4   Left Ankle Dorsiflexion 4/5  was 3+   Left Ankle Inversion 4-/5  was 3+   Left Ankle Eversion 3+/5  was 3   6 minute walk test results    Aerobic Endurance Distance Walked 578   Endurance additional comments 3MWT; gait speed 1.27ms    High Level Balance   High Level Balance Comments TUG 8.27   was 13.17     Strength and ROM measured by KDeniece Ree PT.                [facility] - [facility] 0001    Knee/Hip Exercises: Standing   SLS 60 seconds    SLS with Vectors 2 15"   Ankle Exercises: Standing   Heel Raises 15 reps;Limitations   Heel Raises  Limitations no hands    Toe Raise 15 reps   Toe Raise Limitations no hands    Heel Walk (Round Trip) 2 lenghts on parallel bars   Toe Walk (Round Trip) 2 lenghts on parallel bars   Other Standing Ankle Exercises split stance squats x 10 each                   PT Short Term Goals - 11/25/15 0925    PT SHORT TERM GOAL #1   Title Patient will demonstrate L ankle ROM equal to that of R ankle on all planes in order to assist in reducing pain and improving general mechanics and mobility    Time 3   Period Weeks   Status On-going   PT SHORT TERM GOAL #2   Title Patient to demonstrate reduced edema as evidenced by zero indentations from ankle brace in order to demosntrate general improvement of condition and to assist in improving ROM    Time 3   Period Weeks   Status Partially Met   PT SHORT TERM GOAL #3   Title Patient to demonstrate improved gait mechanics including equal weight bearing and step lengths, improved push off, improved L ankle  dorsiflexion, and improved gait spped to assist in safe returnt o community based activities    Time 3   Period Weeks   Status Achieved   PT SHORT TERM GOAL #4   Title Patient to be independent in correctly and consistently performing appropriate HEP, to be updated PRN    Time 3   Period Weeks   Status Achieved           PT Long Term Goals - 11/25/15 6629    PT LONG TERM GOAL #1   Title Patient to demonstrate L ankle strength of 4+/5 on all planes in order to assist in improving general mechancis and regional stability    Time 6   Period Weeks   Status Partially Met   PT LONG TERM GOAL #2   Title Patient to be able to maintain SLS for at least 60 seconds with no finger tip touch or LOB in order to demonstrate readiness to return to dynamic activities    Time 6   Period Weeks   Status Achieved   PT LONG TERM GOAL #3   Title Patient to report she has been able to perform regular walking program of at least 20 minutes, at least 3 days per week, in order to improve health habits and assist in returning to PLOF    Time 6   Period Weeks   Status On-going   PT LONG TERM GOAL #4   Title Patient to be able to ascend/descend full flght of stairs with no hand rail and reciprocal gait pattern, no unsteadiness and good eccentric control, in order to improve safety at home and in community and return to PLOF    Time 6   Period Weeks   Status Partially Met               Plan - 11/25/15 1205    Clinical Impression Statement Pt has completed 8 sessions with focus on improving Lt ankle stability, mobility and return to functional independence.  Pt has made great progress towards goals meeting 2/4 STG's and 1/4 LTG's.  Improvements include increased AROM and strength of Lt ankle, improved functional status with only 38% limitation (was 55%), improved gait velocity and overall quality of gait.  Pt is experiencing minimal pain  and discomfort overall and slowly returning to normal function.   Pt is able to negotiate stairs reciprocally without handrail, however with decreased speed and comfort.  Ankle Inversion and dynamic stablity of ankle is still limited.  Pt would benefit from continued PT services to address all remaining impairments.     Rehab Potential Excellent   PT Frequency 2x / week   PT Duration 6 weeks   PT Treatment/Interventions ADLs/Self Care Home Management;Cryotherapy;Gait training;Stair training;Functional mobility training;Therapeutic activities;Therapeutic exercise;Balance training;Neuromuscular re-education;Patient/family education;Manual techniques;Taping   PT Next Visit Plan Continue to progress balance and strengthening exercises as tolerated.   Await further instructions from MD; formal re-eval X 4 more sessions.   Begin balance beam activities.    Consulted and Agree with Plan of Care Patient        Problem List Patient Active Problem List   Diagnosis Date Noted  . Fracture of fifth metatarsal bone of left foot 09/07/2015    Teena Irani, PTA/CLT [phone]  11/25/2015, 12:17 PM  [place] [address] [place], [place], [address] Phone: [phone]   Fax:  [phone]  Name: Kayla Meyer MRN: [id number] Date of Birth: 1966-02-14

## 2015-12-08 ENCOUNTER — Encounter (HOSPITAL_COMMUNITY): Payer: Self-pay | Admitting: Physical Therapy

## 2015-12-10 ENCOUNTER — Encounter (HOSPITAL_COMMUNITY): Payer: Self-pay | Admitting: Physical Therapy

## 2015-12-11 ENCOUNTER — Ambulatory Visit (HOSPITAL_COMMUNITY): Payer: PRIVATE HEALTH INSURANCE | Admitting: Physical Therapy

## 2015-12-11 DIAGNOSIS — R262 Difficulty in walking, not elsewhere classified: Secondary | ICD-10-CM | POA: Diagnosis not present

## 2015-12-11 DIAGNOSIS — M25572 Pain in left ankle and joints of left foot: Secondary | ICD-10-CM

## 2015-12-11 DIAGNOSIS — R2681 Unsteadiness on feet: Secondary | ICD-10-CM

## 2015-12-11 DIAGNOSIS — Z8781 Personal history of (healed) traumatic fracture: Secondary | ICD-10-CM

## 2015-12-11 DIAGNOSIS — M25672 Stiffness of left ankle, not elsewhere classified: Secondary | ICD-10-CM | POA: Diagnosis not present

## 2015-12-11 DIAGNOSIS — R29898 Other symptoms and signs involving the musculoskeletal system: Secondary | ICD-10-CM

## 2015-12-11 DIAGNOSIS — M259 Joint disorder, unspecified: Secondary | ICD-10-CM | POA: Diagnosis not present

## 2015-12-11 NOTE — Therapy (Signed)
[place], [place], [address] Phone: [phone]   Fax:  [phone]  Physical Therapy Treatment  Patient Details  Name: Kayla Meyer MRN: [id number] Date of Birth: 01/20/1966 Referring Provider: Dorna Leitz  Encounter Date: 12/11/2015      PT End of Session - 12/11/15 0905    Visit Number 9   Number of Visits 12   Date for PT Re-Evaluation 12/01/15   Authorization Type Worker's Comp/UMR (primary is worker's comp and has limit of 12 sessions approved)   Authorization Time Period 11/03/15 to 01/01/16   Authorization - Visit Number 9   Authorization - Number of Visits 12   PT Start Time 0850   PT Stop Time 0930   PT Time Calculation (min) 40 min   Activity Tolerance Patient tolerated treatment well      Past Medical History  Diagnosis Date  . Migraine   . Tachycardia     Past Surgical History  Procedure Laterality Date  . Abdominal hysterectomy    . Cervical ablation    . Tubal ligation      There were no vitals filed for this visit.  Visit Diagnosis:  History of ankle fracture  Ankle stiffness, left  Ankle weakness  Difficulty walking  Unsteadiness  Pain in joint, ankle and foot, left      Subjective Assessment - 12/11/15 0852    Subjective Pt works third shift as a Marine scientist.  The last two days have been very busy which has caused her to be sore but no real pain.     Currently in Pain? No/denies               [facility] Adult PT Treatment/Exercise - 12/11/15 0001    Balance Poses: Yoga   Tree Pose 2 reps;30 seconds   Exercises   Exercises Ankle   Knee/Hip Exercises: Standing   Heel Raises Both;15 reps   Heel Raises Limitations toe raise x 15    Rocker Board 2 minutes   Rocker Board Limitations Anterior posterior no hands x 2'   Ankle Exercises: Stretches   Plantar Fascia Stretch 3 reps;30 seconds   Slant Board Stretch 3 reps;30 seconds   Ankle Exercises: Standing   BAPS  Standing;Level 3;15 reps   Vector Stance Left;3 reps;15 seconds   SLS Lt 30" x 3   on foam    Ankle Exercises: Sidelying   Ankle Inversion Left;10 reps   Ankle Inversion Weights (lbs) 4                  PT Short Term Goals - 12/11/15 0919    PT SHORT TERM GOAL #1   Title Patient will demonstrate L ankle ROM equal to that of R ankle on all planes in order to assist in reducing pain and improving general mechanics and mobility    Time 3   Period Weeks   Status Achieved   PT SHORT TERM GOAL #2   Title Patient to demonstrate reduced edema as evidenced by zero indentations from ankle brace in order to demosntrate general improvement of condition and to assist in improving ROM    Baseline just took brace off and working without brace this has caused minimal swelling    Time 3   Period Weeks   Status Partially Met   PT SHORT TERM GOAL #3   Title Patient to demonstrate improved gait mechanics including equal weight bearing and step lengths, improved push off,  improved L ankle dorsiflexion, and improved gait spped to assist in safe returnt o community based activities    Time 3   Period Weeks   Status Achieved   PT SHORT TERM GOAL #4   Title Patient to be independent in correctly and consistently performing appropriate HEP, to be updated PRN    Time 3   Period Weeks   Status Achieved           PT Long Term Goals - 12/11/15 0920    PT LONG TERM GOAL #1   Title Patient to demonstrate L ankle strength of 4+/5 on all planes in order to assist in improving general mechancis and regional stability    Time 6   Period Weeks   Status Partially Met   PT LONG TERM GOAL #2   Title Patient to be able to maintain SLS for at least 60 seconds with no finger tip touch or LOB in order to demonstrate readiness to return to dynamic activities    Time 6   Period Weeks   Status Achieved   PT LONG TERM GOAL #3   Title Patient to report she has been able to perform regular walking program  of at least 20 minutes, at least 3 days per week, in order to improve health habits and assist in returning to PLOF    Time 6   Period Weeks   Status On-going   PT LONG TERM GOAL #4   Title Patient to be able to ascend/descend full flght of stairs with no hand rail and reciprocal gait pattern, no unsteadiness and good eccentric control, in order to improve safety at home and in community and return to PLOF    Time 6   Period Weeks   Status Achieved               Plan - 12/11/15 0905    Clinical Impression Statement Pt continues to improve in all aspects of functioning.  Began vector stances and plantar fasicia stretch and sidelying inversion with  weight for improved stabilty .  Pt goal is to be able to complete full night of work without soreness.     Pt will benefit from skilled therapeutic intervention in order to improve on the following deficits Abnormal gait;Hypomobility;Increased edema;Decreased activity tolerance;Decreased strength;Pain;Decreased balance;Difficulty walking;Postural dysfunction   PT Frequency 2x / week   PT Duration 6 weeks   PT Treatment/Interventions ADLs/Self Care Home Management;Cryotherapy;Gait training;Stair training;Functional mobility training;Therapeutic activities;Therapeutic exercise;Balance training;Neuromuscular re-education;Patient/family education;Manual techniques;Taping   PT Next Visit Plan reassess next visit    Consulted and Agree with Plan of Care Patient        Problem List Patient Active Problem List   Diagnosis Date Noted  . Fracture of fifth metatarsal bone of left foot 09/07/2015  Rayetta Humphrey, PT CLT [phone] 12/11/2015, 9:31 AM  [place] [address] [place], [place], [address] Phone: [phone]   Fax:  [phone]  Name: Kayla Meyer MRN: [id number] Date of Birth: 1965-11-24

## 2015-12-15 ENCOUNTER — Ambulatory Visit (HOSPITAL_COMMUNITY): Payer: PRIVATE HEALTH INSURANCE | Admitting: Physical Therapy

## 2015-12-15 DIAGNOSIS — R262 Difficulty in walking, not elsewhere classified: Secondary | ICD-10-CM

## 2015-12-15 DIAGNOSIS — M259 Joint disorder, unspecified: Secondary | ICD-10-CM | POA: Diagnosis not present

## 2015-12-15 DIAGNOSIS — Z8781 Personal history of (healed) traumatic fracture: Secondary | ICD-10-CM | POA: Diagnosis not present

## 2015-12-15 DIAGNOSIS — M25572 Pain in left ankle and joints of left foot: Secondary | ICD-10-CM | POA: Diagnosis not present

## 2015-12-15 DIAGNOSIS — R2681 Unsteadiness on feet: Secondary | ICD-10-CM | POA: Diagnosis not present

## 2015-12-15 DIAGNOSIS — M25672 Stiffness of left ankle, not elsewhere classified: Secondary | ICD-10-CM | POA: Diagnosis not present

## 2015-12-15 DIAGNOSIS — R29898 Other symptoms and signs involving the musculoskeletal system: Secondary | ICD-10-CM

## 2015-12-15 NOTE — Therapy (Signed)
[facility], [place], [address] Phone: [phone]   Fax:  [phone]  Physical Therapy Treatment  Patient Details  Name: Kayla Meyer MRN: [id number] Date of Birth: Sep 02, 1966 Referring Provider: Dorna Leitz  Encounter Date: 12/15/2015      PT End of Session - 12/15/15 0844    Visit Number 10   Number of Visits 12   Date for PT Re-Evaluation 12/01/15   Authorization Type Worker's Comp/UMR (primary is worker's comp and has limit of 12 sessions approved)   Authorization Time Period 11/03/15 to 01/01/16   Authorization - Visit Number 10   Authorization - Number of Visits 12   PT Start Time 9381   PT Stop Time 0930   PT Time Calculation (min) 43 min      Past Medical History  Diagnosis Date  . Migraine   . Tachycardia     Past Surgical History  Procedure Laterality Date  . Abdominal hysterectomy    . Cervical ablation    . Tubal ligation      There were no vitals filed for this visit.  Visit Diagnosis:  History of ankle fracture  Ankle stiffness, left  Ankle weakness  Difficulty walking  Unsteadiness  Pain in joint, ankle and foot, left      Subjective Assessment - 12/15/15 0846    Subjective Pt works third shift states that she has been running all night.    Pertinent History Hx of tachycardia, no other significant PMH in chart or patient reported. Hx 5th metatarsal and lateral fib fracture per patient. Pt reports being cleared to wear tennis shoes now.    How long can you sit comfortably? no problem    How long can you stand comfortably? able to stand for 30 minutes was 15-20   How long can you walk comfortably? Pt is walking 10,000 steps;  She is working 12 hour shifts and walking about the whole time.    Patient Stated Goals get walking better, get regular gait pattern back, get better at stairs    Currently in Pain? Yes   Pain Score 4    Pain Location Ankle   Pain Orientation  Left;Lower   Pain Descriptors / Indicators Sore   Pain Type Chronic pain   Pain Onset More than a month ago   Pain Frequency Intermittent   Aggravating Factors  being up for prolong periods.    Pain Relieving Factors ibuprofen             [facility] PT Assessment - 12/15/15 0001    Assessment   Medical Diagnosis hx of L ankle fracture    Onset Date/Surgical Date 09/01/15   Next MD Visit 12/24/2015   Precautions   Precautions --  none at this time.    Restrictions   Weight Bearing Restrictions No   Prior Function   Level of Independence Independent with basic ADLs;Independent;Independent with gait;Independent with transfers   Vocation Full time employment   Vocation Requirements administrator/coordinator    Leisure walking   Observation/Other Assessments   Observations SLS Rt 60; Lt 60; was  R 37 seconds with intermittent fingertouch; SLS L 4 seconds   Posture/Postural Control   Posture Comments improved weight bearing but after being up for several hours she will increase in the limping.    AROM   Right Ankle Dorsiflexion 11   Right Ankle Plantar Flexion --  wfl    Right Ankle Inversion 35  Right Ankle Eversion 22   Left Ankle Dorsiflexion 10  5 on 2/14; 8 on 3/8   Left Ankle Plantar Flexion --  wfl    Left Ankle Inversion 32  was 18 on 2/14; 35 on 3/8   Left Ankle Eversion 22  was 15    Strength   Right Knee Flexion 4/5   Right Knee Extension 4/5   Left Knee Flexion 4+/5   Left Knee Extension 4/5   Right Ankle Dorsiflexion 5/5   Right Ankle Inversion 5/5   Right Ankle Eversion 5/5   Left Ankle Dorsiflexion 4+/5  3+/5 on 2/14; 4-/5 on 3/8   Left Ankle Inversion 5/5  was 3+ on 2/14; 4- on 3/8   Left Ankle Eversion 4+/5  was 3/5 then 3+/5    Ambulation/Gait   Gait Comments reduced push off and DF L ankle, reduced gait speed, proximal muscle weakness    6 minute walk test results    Aerobic Endurance Distance Walked 742  was 407'on 2/14; 578 on 3/8   Endurance  additional comments 3MWT; gait speed 0.28ms    High Level Balance   High Level Balance Comments TUG 7.43  was 13.73                     [facility] Adult PT Treatment/Exercise - 12/15/15 0001    Manual Therapy   Manual Therapy Soft tissue mobilization;Edema management   Manual therapy comments seperate from all other aspects of this treatment.     Edema Management Retro massage for edema control                  PT Short Term Goals - 12/11/15 0919    PT SHORT TERM GOAL #1   Title Patient will demonstrate L ankle ROM equal to that of R ankle on all planes in order to assist in reducing pain and improving general mechanics and mobility    Time 3   Period Weeks   Status Achieved   PT SHORT TERM GOAL #2   Title Patient to demonstrate reduced edema as evidenced by zero indentations from ankle brace in order to demosntrate general improvement of condition and to assist in improving ROM    Baseline just took brace off and working without brace this has caused minimal swelling    Time 3   Period Weeks   Status Partially Met   PT SHORT TERM GOAL #3   Title Patient to demonstrate improved gait mechanics including equal weight bearing and step lengths, improved push off, improved L ankle dorsiflexion, and improved gait spped to assist in safe returnt o community based activities    Time 3   Period Weeks   Status Achieved   PT SHORT TERM GOAL #4   Title Patient to be independent in correctly and consistently performing appropriate HEP, to be updated PRN    Time 3   Period Weeks   Status Achieved           PT Long Term Goals - 12/11/15 0920    PT LONG TERM GOAL #1   Title Patient to demonstrate L ankle strength of 4+/5 on all planes in order to assist in improving general mechancis and regional stability    Time 6   Period Weeks   Status Partially Met   PT LONG TERM GOAL #2   Title Patient to be able to maintain SLS for at least 60 seconds with no finger tip  touch or  LOB in order to demonstrate readiness to return to dynamic activities    Time 6   Period Weeks   Status Achieved   PT LONG TERM GOAL #3   Title Patient to report she has been able to perform regular walking program of at least 20 minutes, at least 3 days per week, in order to improve health habits and assist in returning to PLOF    Time 6   Period Weeks   Status On-going   PT LONG TERM GOAL #4   Title Patient to be able to ascend/descend full flght of stairs with no hand rail and reciprocal gait pattern, no unsteadiness and good eccentric control, in order to improve safety at home and in community and return to PLOF    Time 6   Period Weeks   Status Achieved               Plan - 12/15/15 0930    Clinical Impression Statement Pt reassessed today with balance and ROM now within normal limits;  Pt TUG and 3 minute test within normal limits as well.  Pt still has minimal deficits in strength.  She will be ready for discharge on Tuesday as schedule.    PT Next Visit Plan focus on strength and manual to decrease swelling as pt has returned to work.  Pt will need to do a foto at end session but may use this reassessment for discharge.         Problem List Patient Active Problem List   Diagnosis Date Noted  . Fracture of fifth metatarsal bone of left foot 09/07/2015    Rayetta Humphrey, PT CLT [phone] 12/15/2015, 9:33 AM  [facility] [address] [place], [place], [address] Phone: [phone]   Fax:  [phone]  Name: Kayla Meyer MRN: [id number] Date of Birth: [birth year]

## 2015-12-17 ENCOUNTER — Ambulatory Visit (HOSPITAL_COMMUNITY): Payer: PRIVATE HEALTH INSURANCE | Admitting: Physical Therapy

## 2015-12-17 DIAGNOSIS — M25672 Stiffness of left ankle, not elsewhere classified: Secondary | ICD-10-CM | POA: Diagnosis not present

## 2015-12-17 DIAGNOSIS — Z8781 Personal history of (healed) traumatic fracture: Secondary | ICD-10-CM

## 2015-12-17 DIAGNOSIS — R262 Difficulty in walking, not elsewhere classified: Secondary | ICD-10-CM | POA: Diagnosis not present

## 2015-12-17 DIAGNOSIS — M25572 Pain in left ankle and joints of left foot: Secondary | ICD-10-CM | POA: Diagnosis not present

## 2015-12-17 DIAGNOSIS — R2681 Unsteadiness on feet: Secondary | ICD-10-CM | POA: Diagnosis not present

## 2015-12-17 DIAGNOSIS — R29898 Other symptoms and signs involving the musculoskeletal system: Secondary | ICD-10-CM

## 2015-12-17 DIAGNOSIS — M259 Joint disorder, unspecified: Secondary | ICD-10-CM | POA: Diagnosis not present

## 2015-12-17 NOTE — Therapy (Signed)
[facility] [place], [place], [address] Phone: [phone]   Fax:  [phone]  Physical Therapy Treatment  Patient Details  Name: Kayla Meyer MRN: [id number] Date of Birth: February 19, 1966 Referring Provider: Dorna Leitz  Encounter Date: 12/17/2015      PT End of Session - 12/17/15 0937    Visit Number 11   Number of Visits 12   Date for PT Re-Evaluation 12/08/15   Authorization Type Worker's Comp/UMR (primary is worker's comp and has limit of 12 sessions approved)   Authorization Time Period 11/03/15 to 01/01/16   Authorization - Visit Number 11   Authorization - Number of Visits 12   PT Start Time 8325   PT Stop Time 0929   PT Time Calculation (min) 42 min   Activity Tolerance Patient tolerated treatment well   Behavior During Therapy [facility] for tasks assessed/performed      Past Medical History  Diagnosis Date  . Migraine   . Tachycardia     Past Surgical History  Procedure Laterality Date  . Abdominal hysterectomy    . Cervical ablation    . Tubal ligation      There were no vitals filed for this visit.  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    Pertinent History Hx of tachycardia, no other significant PMH in chart or patient reported. Hx 5th metatarsal and lateral fib fracture per patient. Pt reports being cleared to wear tennis shoes now.    Patient Stated Goals get walking better, get regular gait pattern back, get better at stairs    Currently in Pain? Yes   Pain Score 1    Pain Location Ankle   Pain Orientation Left                          [facility] Adult PT Treatment/Exercise - 12/17/15 0001    Manual Therapy   Manual Therapy Soft tissue mobilization;Edema management   Manual therapy comments seperate from all other aspects of this treatment.     Edema Management Retro massage for edema control   Soft tissue mobilization soft tissue mobilization to gastroc    Ankle Exercises: Stretches   Plantar Fascia Stretch 3 reps;30 seconds   Slant Board Stretch 3 reps;30 seconds   Ankle Exercises: Standing   BAPS Standing;Level 3;15 reps   Rocker Board 2 minutes;Other (comment)  AP and lateral L LE, fingertip touch    Heel Raises 15 reps;Limitations   Heel Raises Limitations no hands; on foam pad    Toe Raise 15 reps   Toe Raise Limitations no hands; foam pad    Heel Walk (Round Trip) 3 lengths of blue lines    Toe Walk (Round Trip) 3 lengths of blue lines    Other Standing Ankle Exercises lateral weight shifting on air pads x2 minutes    Other Standing Ankle Exercises 3 way hip holds 1x10 each side on foam                 PT Education - 12/17/15 4982  Education provided Yes   Education Details DC and FOTO next session    Person(s) Educated Patient   Methods Explanation   Comprehension Verbalized understanding          PT Short Term Goals - 12/11/15 0919    PT SHORT TERM GOAL #1   Title Patient will demonstrate L ankle ROM equal to that of R ankle on all planes in order to assist in reducing pain and improving general mechanics and mobility    Time 3   Period Weeks   Status Achieved   PT SHORT TERM GOAL #2   Title Patient to demonstrate reduced edema as evidenced by zero indentations from ankle brace in order to demosntrate general improvement of condition and to assist in improving ROM    Baseline just took brace off and working without brace this has caused minimal swelling    Time 3   Period Weeks   Status Partially Met   PT SHORT TERM GOAL #3   Title Patient to demonstrate improved gait mechanics  including equal weight bearing and step lengths, improved push off, improved L ankle dorsiflexion, and improved gait spped to assist in safe returnt o community based activities    Time 3   Period Weeks   Status Achieved   PT SHORT TERM GOAL #4   Title Patient to be independent in correctly and consistently performing appropriate HEP, to be updated PRN    Time 3   Period Weeks   Status Achieved           PT Long Term Goals - 12/11/15 0920    PT LONG TERM GOAL #1   Title Patient to demonstrate L ankle strength of 4+/5 on all planes in order to assist in improving general mechancis and regional stability    Time 6   Period Weeks   Status Partially Met   PT LONG TERM GOAL #2   Title Patient to be able to maintain SLS for at least 60 seconds with no finger tip touch or LOB in order to demonstrate readiness to return to dynamic activities    Time 6   Period Weeks   Status Achieved   PT LONG TERM GOAL #3   Title Patient to report she has been able to perform regular walking program of at least 20 minutes, at least 3 days per week, in order to improve health habits and assist in returning to PLOF    Time 6   Period Weeks   Status On-going   PT LONG TERM GOAL #4   Title Patient to be able to ascend/descend full flght of stairs with no hand rail and reciprocal gait pattern, no unsteadiness and good eccentric control, in order to improve safety at home and in community and return to PLOF    Time 6   Period Weeks   Status Achieved               Plan - 12/17/15 7342    Clinical Impression Statement Patient arrived today reporting she is doing very well, just feels that she needs to continue working on her strength. Focused on functional strength and balance today, progressed some exercises by having patient do them on foam pad or with one LE only; also intrdduced air pads today for ankle stabilization. Patient did complain of cramping sensation during more challenging exercises,  and PT finished session with manual to L ankle/gastroc today. Patient reported 0/10 pain and ankle fatigue at end of session;  she continues to do very well and remains on track for DC tuesday.    Pt will benefit from skilled therapeutic intervention in order to improve on the following deficits Abnormal gait;Hypomobility;Increased edema;Decreased activity tolerance;Decreased strength;Pain;Decreased balance;Difficulty walking;Postural dysfunction   Rehab Potential Excellent   PT Frequency 2x / week   PT Duration 6 weeks   PT Treatment/Interventions ADLs/Self Care Home Management;Cryotherapy;Gait training;Stair training;Functional mobility training;Therapeutic activities;Therapeutic exercise;Balance training;Neuromuscular re-education;Patient/family education;Manual techniques;Taping   PT Next Visit Plan FOTO and DC    PT Home Exercise Plan review as needed.    Consulted and Agree with Plan of Care Patient        Problem List Patient Active Problem List   Diagnosis Date Noted  . Fracture of fifth metatarsal bone of left foot 09/07/2015   Deniece Ree PT, DPT [facility] [address] [place], [place], [address] Phone: [phone]   Fax:  [phone]  Name: SANDRIKA SCHWINN MRN: [id number] Date of Birth: 06/11/1966

## 2015-12-22 ENCOUNTER — Ambulatory Visit (HOSPITAL_COMMUNITY): Payer: PRIVATE HEALTH INSURANCE | Attending: Orthopedic Surgery | Admitting: Physical Therapy

## 2015-12-22 DIAGNOSIS — M25672 Stiffness of left ankle, not elsewhere classified: Secondary | ICD-10-CM | POA: Diagnosis not present

## 2015-12-22 DIAGNOSIS — R2681 Unsteadiness on feet: Secondary | ICD-10-CM | POA: Insufficient documentation

## 2015-12-22 DIAGNOSIS — M259 Joint disorder, unspecified: Secondary | ICD-10-CM | POA: Insufficient documentation

## 2015-12-22 DIAGNOSIS — R29898 Other symptoms and signs involving the musculoskeletal system: Secondary | ICD-10-CM

## 2015-12-22 DIAGNOSIS — M25572 Pain in left ankle and joints of left foot: Secondary | ICD-10-CM | POA: Insufficient documentation

## 2015-12-22 DIAGNOSIS — Z8781 Personal history of (healed) traumatic fracture: Secondary | ICD-10-CM | POA: Insufficient documentation

## 2015-12-22 DIAGNOSIS — R262 Difficulty in walking, not elsewhere classified: Secondary | ICD-10-CM | POA: Diagnosis not present

## 2015-12-22 NOTE — Therapy (Signed)
[facility] [place], [place], [address] Phone: [phone]   Fax:  [phone]  Physical Therapy Treatment (Discharge)  Patient Details  Name: Kayla Meyer MRN: [id number] Date of Birth: April 28, 1966 Referring Provider: Dorna Leitz  Encounter Date: 12/22/2015      PT End of Session - 12/22/15 1227    Visit Number 12   Number of Visits 12   Authorization Type Worker's Comp/UMR (primary is worker's comp and has limit of 12 sessions approved)   Authorization Time Period 11/03/15 to 01/01/16   Authorization - Visit Number 12   Authorization - Number of Visits 12   PT Start Time 0848   PT Stop Time 0931   PT Time Calculation (min) 43 min   Activity Tolerance Patient tolerated treatment well   Behavior During Therapy [facility] for tasks assessed/performed      Past Medical History  Diagnosis Date  . Migraine   . Tachycardia     Past Surgical History  Procedure Laterality Date  . Abdominal hysterectomy    . Cervical ablation    . Tubal ligation      There were no vitals filed for this visit.  Visit Diagnosis:  History of ankle fracture  Ankle stiffness, left  Ankle weakness  Difficulty walking  Unsteadiness  Pain in joint, ankle and foot, left      Subjective Assessment - 12/22/15 0849    Subjective Patient reporst she is doing well, just got off work and feeling well. She did have some edema in her ankle this weekend, both actually, but she believes that this is because she was up on her feet a lot    Pertinent History Hx of tachycardia, no other significant PMH in chart or patient reported. Hx 5th metatarsal and lateral fib fracture per patient. Pt reports being cleared to wear tennis shoes now.    Currently in Pain? No/denies  little soreness but not enough to call this pain or discomfort                          [facility] Adult PT Treatment/Exercise - 12/22/15 0001    Knee/Hip Exercises:  Aerobic   Nustep nustep x6 minutes while PT prepared advanced HEP    Ankle Exercises: Stretches   Plantar Fascia Stretch 3 reps;30 seconds   Slant Board Stretch 3 reps;30 seconds   Ankle Exercises: Standing   BAPS Standing;Level 3;15 reps   Rocker Board 2 minutes;Other (comment)  U LE, AP and lateral    Heel Walk (Round Trip) 3 lengths of blue lines    Toe Walk (Round Trip) 3 lengths of blue lines    Other Standing Ankle Exercises --   Other Standing Ankle Exercises 3 way hip holds 1x10 each side on foam                 PT Education - 12/22/15 1226    Education provided Yes   Education Details advanced HEP, DC today    Person(s) Educated Patient   Methods Explanation   Comprehension Verbalized understanding          PT Short Term Goals - 12/22/15 1228    PT SHORT TERM GOAL #1   Title Patient will demonstrate L ankle ROM equal to that of R ankle on all planes in order to assist in reducing pain and improving general mechanics and mobility    Time 3   Period Weeks  Status Achieved   PT SHORT TERM GOAL #2   Title Patient to demonstrate reduced edema as evidenced by zero indentations from ankle brace in order to demosntrate general improvement of condition and to assist in improving ROM    Baseline just took brace off and working without brace this has caused minimal swelling    Time 3   Period Weeks   Status Partially Met   PT SHORT TERM GOAL #3   Title Patient to demonstrate improved gait mechanics including equal weight bearing and step lengths, improved push off, improved L ankle dorsiflexion, and improved gait spped to assist in safe returnt o community based activities    Time 3   Period Weeks   Status Achieved   PT SHORT TERM GOAL #4   Title Patient to be independent in correctly and consistently performing appropriate HEP, to be updated PRN    Time 3   Period Weeks   Status Achieved           PT Long Term Goals - 12/22/15 1229    PT LONG TERM GOAL  #1   Title Patient to demonstrate L ankle strength of 4+/5 on all planes in order to assist in improving general mechancis and regional stability    Time 6   Period Weeks   Status Partially Met   PT LONG TERM GOAL #2   Title Patient to be able to maintain SLS for at least 60 seconds with no finger tip touch or LOB in order to demonstrate readiness to return to dynamic activities    Time 6   Period Weeks   Status Achieved   PT LONG TERM GOAL #3   Title Patient to report she has been able to perform regular walking program of at least 20 minutes, at least 3 days per week, in order to improve health habits and assist in returning to PLOF    Time 6   Period Weeks   Status On-going   PT LONG TERM GOAL #4   Title Patient to be able to ascend/descend full flght of stairs with no hand rail and reciprocal gait pattern, no unsteadiness and good eccentric control, in order to improve safety at home and in community and return to PLOF    Time 6   Period Weeks   Status Achieved               Plan - 12/22/15 1227    Clinical Impression Statement Discharge treatment performed today; patient doing very well with no concerns, but does report she has had to do some edema control recently as she was on her feet a lot this weekend and had some swelling. Perofmred some advanced standing functional exercises and discussed her ucrrent HEP and appropriate adjustments to it. Placed patient on Nustep while PT designed appropriate advanced activities for patient to perform, including education of how to do retrograde massage. DC today due to high functional level.    Pt will benefit from skilled therapeutic intervention in order to improve on the following deficits Abnormal gait;Hypomobility;Increased edema;Decreased activity tolerance;Decreased strength;Pain;Decreased balance;Difficulty walking;Postural dysfunction   Rehab Potential Excellent   PT Next Visit Plan DC    PT Home Exercise Plan review as  needed.    Consulted and Agree with Plan of Care Patient        Problem List Patient Active Problem List   Diagnosis Date Noted  . Fracture of fifth metatarsal bone of left foot 09/07/2015    PHYSICAL  THERAPY DISCHARGE SUMMARY  Visits from Start of Care: 12  Current functional level related to goals / functional outcomes: Patient doing extremely well overall, independent in appropriate exercises and edema management and has basically returned to her PLOF at this point. Agreeable to DC today.    Remaining deficits: Edema, mild weakness and unsteadiness    Education / Equipment: Advanced HEP  Plan: Patient agrees to discharge.  Patient goals were met. Patient is being discharged due to being pleased with the current functional level.  ?????       Deniece Ree PT, DPT [facility] [address] [place], [place], [address] Phone: [phone]   Fax:  [phone]  Name: VERNEDA HOLLOPETER MRN: [id number] Date of Birth: 1966/09/05

## 2015-12-22 NOTE — Patient Instructions (Signed)
Narrowing Stance: Eyes Shut     Stand facing forward, eyes closed, with your feet as close together as possible. Stand __15__ seconds then repeat. You may open your eyes between repetitions.  Repeat __2__ times. Do___2_ sets per day.  http://bt.exer.us/261   Copyright  VHI. All rights reserved.     TANDEM STANCE   Stand with one foot directly in front of the other so that the toes of one foot touches the heel of the other. You may close your eyes or stand on a folded pillow to make it harder.     Maintain your balance, holding at least 15-20 seconds, then switch feet.   Do this 2 times each side, twice a day.    SINGLE LEG STANCE - SLS  Stand on one leg and maintain your balance. You may close your eyes or stand on a folded pillow to make this more difficult.  Hold at least 15-20 seconds and repeat 2 times each side, twice a day.    RETROGRADE MASSAGE  Sit with your left leg/ankle elevated; using only gentle pressure, stroke the skin starting from your toes and moving up towards your knee on all sides of your ankle. NEVER start at your knee and move down towards your toes. Do this for 5-10 minutes at a time, 1-2 times per day.

## 2016-03-03 DIAGNOSIS — Z6841 Body Mass Index (BMI) 40.0 and over, adult: Secondary | ICD-10-CM | POA: Diagnosis not present

## 2016-03-03 DIAGNOSIS — R945 Abnormal results of liver function studies: Secondary | ICD-10-CM | POA: Diagnosis not present

## 2016-03-03 DIAGNOSIS — E781 Pure hyperglyceridemia: Secondary | ICD-10-CM | POA: Diagnosis not present

## 2016-03-03 DIAGNOSIS — Z1389 Encounter for screening for other disorder: Secondary | ICD-10-CM | POA: Diagnosis not present

## 2016-03-03 DIAGNOSIS — E063 Autoimmune thyroiditis: Secondary | ICD-10-CM | POA: Diagnosis not present

## 2016-03-03 DIAGNOSIS — E669 Obesity, unspecified: Secondary | ICD-10-CM | POA: Diagnosis not present

## 2016-03-03 MED FILL — PHENTERMINE 37.5 MG TABLET: 37.5 | 30 days supply | Qty: 30 | Fill #0

## 2016-03-03 MED FILL — CIPROFLOXACIN HCL 500 MG TA: 500 | 7 days supply | Qty: 14 | Fill #0

## 2016-03-03 MED FILL — LEVOTHYROXINE 88 MCG TABLET: 88 | 90 days supply | Qty: 90 | Fill #0

## 2016-03-07 DIAGNOSIS — R945 Abnormal results of liver function studies: Secondary | ICD-10-CM | POA: Diagnosis not present

## 2016-03-07 DIAGNOSIS — E781 Pure hyperglyceridemia: Secondary | ICD-10-CM | POA: Diagnosis not present

## 2016-03-08 ENCOUNTER — Other Ambulatory Visit: Payer: Self-pay | Admitting: Internal Medicine

## 2016-03-08 DIAGNOSIS — R945 Abnormal results of liver function studies: Secondary | ICD-10-CM

## 2016-03-16 ENCOUNTER — Ambulatory Visit (HOSPITAL_COMMUNITY)
Admission: RE | Admit: 2016-03-16 | Discharge: 2016-03-16 | Disposition: A | Discharge status: Home / Self Care | Payer: 59 | Source: Ambulatory Visit | Attending: Internal Medicine | Admitting: Internal Medicine

## 2016-03-16 DIAGNOSIS — K760 Fatty (change of) liver, not elsewhere classified: Secondary | ICD-10-CM | POA: Insufficient documentation

## 2016-03-16 DIAGNOSIS — R945 Abnormal results of liver function studies: Secondary | ICD-10-CM | POA: Diagnosis not present

## 2016-03-31 MED FILL — PHENTERMINE 37.5 MG TABLET: 37.5 | 30 days supply | Qty: 30 | Fill #1

## 2016-04-01 ENCOUNTER — Other Ambulatory Visit (HOSPITAL_COMMUNITY): Payer: Self-pay | Admitting: Internal Medicine

## 2016-04-01 DIAGNOSIS — Z1231 Encounter for screening mammogram for malignant neoplasm of breast: Secondary | ICD-10-CM

## 2016-04-08 DIAGNOSIS — E063 Autoimmune thyroiditis: Secondary | ICD-10-CM | POA: Diagnosis not present

## 2016-04-11 MED FILL — LEVOTHYROXINE 75 MCG TABLET: 75 | 30 days supply | Qty: 30 | Fill #0

## 2016-04-13 ENCOUNTER — Ambulatory Visit (HOSPITAL_COMMUNITY)
Admission: RE | Admit: 2016-04-13 | Discharge: 2016-04-13 | Disposition: A | Discharge status: Home / Self Care | Payer: 59 | Source: Ambulatory Visit | Attending: Internal Medicine | Admitting: Internal Medicine

## 2016-04-13 DIAGNOSIS — Z1231 Encounter for screening mammogram for malignant neoplasm of breast: Secondary | ICD-10-CM | POA: Diagnosis not present

## 2016-05-04 MED FILL — PHENTERMINE 37.5 MG TABLET: 37.5 | 30 days supply | Qty: 30 | Fill #2

## 2016-05-13 DIAGNOSIS — E063 Autoimmune thyroiditis: Secondary | ICD-10-CM | POA: Diagnosis not present

## 2016-05-24 DIAGNOSIS — Z1389 Encounter for screening for other disorder: Secondary | ICD-10-CM | POA: Diagnosis not present

## 2016-05-24 DIAGNOSIS — J042 Acute laryngotracheitis: Secondary | ICD-10-CM | POA: Diagnosis not present

## 2016-05-24 DIAGNOSIS — J9801 Acute bronchospasm: Secondary | ICD-10-CM | POA: Diagnosis not present

## 2016-05-24 DIAGNOSIS — J019 Acute sinusitis, unspecified: Secondary | ICD-10-CM | POA: Diagnosis not present

## 2016-05-24 DIAGNOSIS — E063 Autoimmune thyroiditis: Secondary | ICD-10-CM | POA: Diagnosis not present

## 2016-05-24 DIAGNOSIS — Z6839 Body mass index (BMI) 39.0-39.9, adult: Secondary | ICD-10-CM | POA: Diagnosis not present

## 2016-05-24 MED FILL — LEVOTHYROXINE 50 MCG TABLET: 50 | 60 days supply | Qty: 60 | Fill #0

## 2016-06-14 MED FILL — PHENTERMINE 37.5 MG TABLET: 37.5 | 30 days supply | Qty: 30 | Fill #0

## 2016-07-19 MED FILL — PHENTERMINE 37.5 MG TABLET: 37.5 | 30 days supply | Qty: 30 | Fill #1

## 2016-08-09 DIAGNOSIS — E039 Hypothyroidism, unspecified: Secondary | ICD-10-CM | POA: Diagnosis not present

## 2016-08-19 MED FILL — PHENTERMINE 37.5 MG TABLET: 37.5 | 30 days supply | Qty: 30 | Fill #2

## 2016-08-19 MED FILL — LEVOTHYROXINE 50 MCG TABLET: 50 | 90 days supply | Qty: 90 | Fill #0

## 2017-11-23 ENCOUNTER — Other Ambulatory Visit (HOSPITAL_COMMUNITY): Payer: Self-pay | Admitting: Internal Medicine

## 2017-11-23 DIAGNOSIS — Z1231 Encounter for screening mammogram for malignant neoplasm of breast: Secondary | ICD-10-CM

## 2017-11-30 ENCOUNTER — Encounter (HOSPITAL_COMMUNITY): Payer: Self-pay

## 2017-11-30 ENCOUNTER — Ambulatory Visit (HOSPITAL_COMMUNITY)
Admission: RE | Admit: 2017-11-30 | Discharge: 2017-11-30 | Disposition: A | Discharge status: Home / Self Care | Payer: No Typology Code available for payment source | Source: Ambulatory Visit | Attending: Internal Medicine | Admitting: Internal Medicine

## 2017-11-30 DIAGNOSIS — Z1231 Encounter for screening mammogram for malignant neoplasm of breast: Secondary | ICD-10-CM | POA: Diagnosis present

## 2019-02-05 ENCOUNTER — Other Ambulatory Visit (HOSPITAL_COMMUNITY): Payer: Self-pay | Admitting: Internal Medicine

## 2019-02-05 DIAGNOSIS — Z1231 Encounter for screening mammogram for malignant neoplasm of breast: Secondary | ICD-10-CM

## 2020-04-29 ENCOUNTER — Emergency Department (HOSPITAL_COMMUNITY)
Admission: EM | Admit: 2020-04-29 | Discharge: 2020-04-29 | Disposition: A | Discharge status: Home / Self Care | Payer: No Typology Code available for payment source | Attending: Emergency Medicine | Admitting: Emergency Medicine

## 2020-04-29 ENCOUNTER — Encounter (HOSPITAL_COMMUNITY): Payer: Self-pay | Admitting: Emergency Medicine

## 2020-04-29 ENCOUNTER — Other Ambulatory Visit: Payer: Self-pay

## 2020-04-29 DIAGNOSIS — X501XXA Overexertion from prolonged static or awkward postures, initial encounter: Secondary | ICD-10-CM | POA: Diagnosis not present

## 2020-04-29 DIAGNOSIS — F17200 Nicotine dependence, unspecified, uncomplicated: Secondary | ICD-10-CM | POA: Insufficient documentation

## 2020-04-29 DIAGNOSIS — Y999 Unspecified external cause status: Secondary | ICD-10-CM | POA: Insufficient documentation

## 2020-04-29 DIAGNOSIS — S39012A Strain of muscle, fascia and tendon of lower back, initial encounter: Secondary | ICD-10-CM | POA: Diagnosis not present

## 2020-04-29 DIAGNOSIS — Y929 Unspecified place or not applicable: Secondary | ICD-10-CM | POA: Insufficient documentation

## 2020-04-29 DIAGNOSIS — Y939 Activity, unspecified: Secondary | ICD-10-CM | POA: Insufficient documentation

## 2020-04-29 MED ORDER — METHOCARBAMOL 500 MG PO TABS: 500.0000 mg | ORAL_TABLET | Freq: Three times a day (TID) | ORAL | 0 refills | Status: DC | PRN

## 2020-04-29 MED ORDER — DEXAMETHASONE SODIUM PHOSPHATE 10 MG/ML IJ SOLN
10.0000 mg | Freq: Once | INTRAMUSCULAR | Status: AC
  Administered 2020-04-29: 10 mg via INTRAMUSCULAR
  Filled 2020-04-29: qty 1

## 2020-04-29 MED ORDER — HYDROCODONE-ACETAMINOPHEN 5-325 MG PO TABS: 1.0000 | ORAL_TABLET | ORAL | 0 refills | Status: DC | PRN

## 2020-04-29 MED ORDER — KETOROLAC TROMETHAMINE 60 MG/2ML IM SOLN
60.0000 mg | Freq: Once | INTRAMUSCULAR | Status: AC
  Administered 2020-04-29: 60 mg via INTRAMUSCULAR
  Filled 2020-04-29: qty 2

## 2020-04-29 NOTE — ED Triage Notes (Signed)
Pt states 1.5 days ago she stood from her couch and her lower back tightened up. Pt has been taking ibuprofen and tylenol with minimal relief.

## 2020-04-29 NOTE — ED Provider Notes (Signed)
[facility] EMERGENCY DEPARTMENT Provider Note   CSN: [id number] Arrival date & time: 04/29/20  0009     History Chief Complaint  Patient presents with  . Back Pain    Kayla Meyer is a 54 y.o. female.  Patient presents to the emergency department for evaluation of back pain.  Patient reports that the pain started 1-1/2 days ago when she stood up and felt a sharp pain in the right lower back.  Pain has been persistent since then.  She reports that it worsens if she tries to sit for a long time.  She also has noticed worsening with lifting the right leg.  Pain does not radiate down the leg.  No numbness, tingling or weakness of the lower extremities.  No change in bowel or bladder function.        Past Medical History:  Diagnosis Date  . Migraine   . Tachycardia     Patient Active Problem List   Diagnosis Date Noted  . Fracture of fifth metatarsal bone of left foot 09/07/2015    Past Surgical History:  Procedure Laterality Date  . ABDOMINAL HYSTERECTOMY    . CERVICAL ABLATION    . TUBAL LIGATION       OB History   No obstetric history on file.     No family history on file.  Social History   Tobacco Use  . Smoking status: Current Every Day Smoker    Packs/day: 0.33  . Smokeless tobacco: Never Used  Substance Use Topics  . Alcohol use: Yes    Comment: rarely  . Drug use: No    Home Medications Prior to Admission medications   Medication Sig Start Date End Date Taking? Authorizing Provider  HYDROcodone-acetaminophen (NORCO/VICODIN) 5-325 MG tablet Take 1-2 tablets by mouth every 4 (four) hours as needed. 04/29/20   Gilda Crease, MD  ibuprofen (ADVIL,MOTRIN) 800 MG tablet Take 1 tablet (800 mg total) by mouth every 8 (eight) hours as needed. 09/07/15   Vickki Hearing, MD  loratadine (CLARITIN) 10 MG tablet Take 10 mg by mouth daily.    [provider]  methocarbamol (ROBAXIN) 500 MG tablet Take 1 tablet (500 mg total) by mouth  every 8 (eight) hours as needed for muscle spasms. 04/29/20   Gilda Crease, MD    Allergies    Sulfa antibiotics  Review of Systems   Review of Systems  Musculoskeletal: Positive for back pain.  All other systems reviewed and are negative.   Physical Exam Updated Vital Signs BP (!) 147/77   Pulse 65   Temp 98.4 F (36.9 C)   Resp 18   Ht 5\' 4"  (1.626 m)   Wt 108.9 kg   SpO2 99%   BMI 41.20 kg/m   Physical Exam Vitals and nursing note reviewed.  Constitutional:      Appearance: Normal appearance.  HENT:     Head: Normocephalic.  Cardiovascular:     Rate and Rhythm: Normal rate and regular rhythm.  Pulmonary:     Effort: Pulmonary effort is normal.     Breath sounds: Normal breath sounds.  Musculoskeletal:     Cervical back: Neck supple.     Thoracic back: Normal.     Lumbar back: Spasms and tenderness present. No bony tenderness. Negative right straight leg raise test and negative left straight leg raise test.       Back:  Neurological:     Mental Status: She is alert.  Sensory: Sensation is intact.     Motor: Motor function is intact.     Coordination: Coordination is intact.     ED Results / Procedures / Treatments   Labs (all labs ordered are listed, but only abnormal results are displayed) Labs Reviewed - No data to display  EKG None  Radiology No results found.  Procedures Procedures (including critical care time)  Medications Ordered in ED Medications  ketorolac (TORADOL) injection 60 mg (60 mg Intramuscular Given 04/29/20 0119)  dexamethasone (DECADRON) injection 10 mg (10 mg Intramuscular Given 04/29/20 0120)    ED Course  I have reviewed the triage vital signs and the nursing notes.  Pertinent labs & imaging results that were available during my care of the patient were reviewed by me and considered in my medical decision making (see chart for details).    MDM Rules/Calculators/A&P                          Patient  presents to the ER with musculoskeletal back pain. Examination reveals back tenderness without any associated neurologic findings.  History seems to suggest acute spasm of paraspinal muscle.  Patient's strength, sensation and reflexes were normal. There is no evidence of saddle anesthesia. Patient does not have a foot drop. Patient has not experienced any change in bowel or bladder function. As such, patient did not require any imaging or further studies. Patient was treated with analgesia.  Discussed using warm compresses, stretching exercises to help with recovery.  Final Clinical Impression(s) / ED Diagnoses Final diagnoses:  Strain of lumbar region, initial encounter    Rx / DC Orders ED Discharge Orders         Ordered    HYDROcodone-acetaminophen (NORCO/VICODIN) 5-325 MG tablet  Every 4 hours PRN     Discontinue  Reprint     04/29/20 0121    methocarbamol (ROBAXIN) 500 MG tablet  Every 8 hours PRN     Discontinue     04/29/20 0121           Gilda Crease, MD 04/29/20 0121

## 2020-04-30 MED FILL — Hydrocodone-Acetaminophen Tab 5-325 MG: ORAL | Qty: 6 | Status: AC

## 2021-03-03 ENCOUNTER — Other Ambulatory Visit (HOSPITAL_COMMUNITY): Payer: Self-pay | Admitting: Internal Medicine

## 2021-03-03 DIAGNOSIS — Z1231 Encounter for screening mammogram for malignant neoplasm of breast: Secondary | ICD-10-CM

## 2021-03-04 ENCOUNTER — Other Ambulatory Visit (HOSPITAL_COMMUNITY): Payer: Self-pay

## 2021-03-04 MED ORDER — ESCITALOPRAM OXALATE 10 MG PO TABS
10.0000 mg | ORAL_TABLET | Freq: Every day | ORAL | 11 refills | Status: DC
  Filled 2021-03-04: qty 30, 30d supply, fill #0
  Filled 2021-04-05: qty 30, 30d supply, fill #1
  Filled 2021-05-04: qty 30, 30d supply, fill #2
  Filled 2021-06-02: qty 30, 30d supply, fill #3
  Filled 2021-07-05: qty 30, 30d supply, fill #4
  Filled 2021-08-02: qty 30, 30d supply, fill #5
  Filled 2021-08-30: qty 30, 30d supply, fill #6
  Filled 2021-10-03: qty 30, 30d supply, fill #7
  Filled 2021-11-01: qty 30, 30d supply, fill #8
  Filled 2021-12-04: qty 30, 30d supply, fill #9
  Filled 2022-01-02: qty 30, 30d supply, fill #10
  Filled 2022-02-01: qty 30, 30d supply, fill #11

## 2021-03-04 MED ORDER — CARESTART COVID-19 HOME TEST VI KIT
PACK | 0 refills | Status: DC
  Filled 2021-03-04: qty 4, 4d supply, fill #0

## 2021-03-05 ENCOUNTER — Other Ambulatory Visit (HOSPITAL_COMMUNITY): Payer: Self-pay

## 2021-03-10 ENCOUNTER — Ambulatory Visit (HOSPITAL_COMMUNITY)
Admission: RE | Admit: 2021-03-10 | Discharge: 2021-03-10 | Disposition: A | Discharge status: Home / Self Care | Payer: No Typology Code available for payment source | Source: Ambulatory Visit | Attending: Internal Medicine | Admitting: Internal Medicine

## 2021-03-10 DIAGNOSIS — Z1231 Encounter for screening mammogram for malignant neoplasm of breast: Secondary | ICD-10-CM | POA: Diagnosis not present

## 2021-03-11 ENCOUNTER — Other Ambulatory Visit (HOSPITAL_COMMUNITY): Payer: Self-pay | Admitting: Internal Medicine

## 2021-03-11 DIAGNOSIS — R928 Other abnormal and inconclusive findings on diagnostic imaging of breast: Secondary | ICD-10-CM

## 2021-03-24 ENCOUNTER — Ambulatory Visit (HOSPITAL_COMMUNITY)
Admission: RE | Admit: 2021-03-24 | Discharge: 2021-03-24 | Disposition: A | Discharge status: Home / Self Care | Payer: No Typology Code available for payment source | Source: Ambulatory Visit | Attending: Internal Medicine | Admitting: Internal Medicine

## 2021-03-24 DIAGNOSIS — R928 Other abnormal and inconclusive findings on diagnostic imaging of breast: Secondary | ICD-10-CM | POA: Diagnosis not present

## 2021-04-06 ENCOUNTER — Other Ambulatory Visit (HOSPITAL_COMMUNITY): Payer: Self-pay

## 2021-05-04 ENCOUNTER — Other Ambulatory Visit (HOSPITAL_COMMUNITY): Payer: Self-pay

## 2021-05-04 MED ORDER — CARESTART COVID-19 HOME TEST VI KIT
PACK | 0 refills | Status: DC
  Filled 2021-05-04: qty 2, 2d supply, fill #0

## 2021-06-02 ENCOUNTER — Other Ambulatory Visit (HOSPITAL_COMMUNITY): Payer: Self-pay

## 2021-06-02 MED ORDER — CARESTART COVID-19 HOME TEST VI KIT
PACK | 0 refills | Status: DC
  Filled 2021-06-02: qty 2, 2d supply, fill #0

## 2021-07-05 ENCOUNTER — Other Ambulatory Visit (HOSPITAL_COMMUNITY): Payer: Self-pay

## 2021-08-03 ENCOUNTER — Other Ambulatory Visit (HOSPITAL_COMMUNITY): Payer: Self-pay

## 2021-08-30 ENCOUNTER — Other Ambulatory Visit (HOSPITAL_COMMUNITY): Payer: Self-pay

## 2021-08-30 MED ORDER — CARESTART COVID-19 HOME TEST VI KIT
PACK | 0 refills | Status: DC
  Filled 2021-08-30: qty 4, 4d supply, fill #0

## 2021-10-04 ENCOUNTER — Other Ambulatory Visit (HOSPITAL_COMMUNITY): Payer: Self-pay

## 2021-10-04 MED ORDER — CARESTART COVID-19 HOME TEST VI KIT
PACK | 0 refills | Status: DC
  Filled 2021-10-04: qty 4, 4d supply, fill #0

## 2021-10-20 ENCOUNTER — Other Ambulatory Visit (HOSPITAL_COMMUNITY): Payer: Self-pay | Admitting: Internal Medicine

## 2021-10-20 DIAGNOSIS — N6011 Diffuse cystic mastopathy of right breast: Secondary | ICD-10-CM

## 2021-11-01 ENCOUNTER — Other Ambulatory Visit (HOSPITAL_COMMUNITY): Payer: Self-pay

## 2021-11-02 ENCOUNTER — Other Ambulatory Visit (HOSPITAL_COMMUNITY): Payer: Self-pay

## 2021-11-02 MED ORDER — CARESTART COVID-19 HOME TEST VI KIT
PACK | 0 refills | Status: DC
Start: 2021-11-02 — End: 2021-12-06
  Filled 2021-11-02: qty 4, 4d supply, fill #0

## 2021-11-11 ENCOUNTER — Encounter (HOSPITAL_COMMUNITY): Payer: Self-pay

## 2021-11-11 ENCOUNTER — Ambulatory Visit (HOSPITAL_COMMUNITY)
Admission: RE | Admit: 2021-11-11 | Discharge: 2021-11-11 | Disposition: A | Discharge status: Home / Self Care | Payer: No Typology Code available for payment source | Source: Ambulatory Visit | Attending: Internal Medicine | Admitting: Internal Medicine

## 2021-11-11 ENCOUNTER — Other Ambulatory Visit (HOSPITAL_COMMUNITY): Payer: Self-pay | Admitting: Internal Medicine

## 2021-11-11 ENCOUNTER — Other Ambulatory Visit: Payer: Self-pay

## 2021-11-11 DIAGNOSIS — N6011 Diffuse cystic mastopathy of right breast: Secondary | ICD-10-CM

## 2021-11-23 ENCOUNTER — Other Ambulatory Visit (HOSPITAL_COMMUNITY): Payer: No Typology Code available for payment source

## 2021-11-23 ENCOUNTER — Encounter (HOSPITAL_COMMUNITY): Payer: No Typology Code available for payment source

## 2021-12-04 ENCOUNTER — Other Ambulatory Visit (HOSPITAL_COMMUNITY): Payer: Self-pay

## 2021-12-06 ENCOUNTER — Other Ambulatory Visit (HOSPITAL_COMMUNITY): Payer: Self-pay

## 2021-12-06 MED ORDER — CARESTART COVID-19 HOME TEST VI KIT
PACK | 0 refills | Status: DC
  Filled 2021-12-06: qty 8, 8d supply, fill #0

## 2022-01-03 ENCOUNTER — Other Ambulatory Visit (HOSPITAL_COMMUNITY): Payer: Self-pay

## 2022-02-02 ENCOUNTER — Other Ambulatory Visit (HOSPITAL_COMMUNITY): Payer: Self-pay

## 2022-04-25 ENCOUNTER — Other Ambulatory Visit (HOSPITAL_COMMUNITY): Payer: Self-pay | Admitting: Internal Medicine

## 2022-04-25 ENCOUNTER — Other Ambulatory Visit (HOSPITAL_COMMUNITY): Payer: Self-pay

## 2022-04-25 DIAGNOSIS — R053 Chronic cough: Secondary | ICD-10-CM

## 2022-04-25 MED ORDER — PHENTERMINE HCL 37.5 MG PO TABS
37.5000 mg | ORAL_TABLET | Freq: Every day | ORAL | 2 refills | Status: DC
  Filled 2022-04-25: qty 30, 30d supply, fill #0
  Filled 2022-05-20 (×2): qty 30, 30d supply, fill #1
  Filled 2022-06-14 – 2022-06-27 (×2): qty 30, 30d supply, fill #2

## 2022-04-25 MED ORDER — ALPRAZOLAM 0.5 MG PO TABS
0.5000 mg | ORAL_TABLET | ORAL | 2 refills | Status: DC | PRN
  Filled 2022-04-25: qty 60, 30d supply, fill #0
  Filled 2022-06-14: qty 60, 30d supply, fill #1
  Filled 2022-08-30: qty 60, 30d supply, fill #2

## 2022-05-12 ENCOUNTER — Ambulatory Visit (HOSPITAL_COMMUNITY)
Admission: RE | Admit: 2022-05-12 | Discharge: 2022-05-12 | Disposition: A | Discharge status: Home / Self Care | Payer: No Typology Code available for payment source | Source: Ambulatory Visit | Attending: Internal Medicine | Admitting: Internal Medicine

## 2022-05-12 DIAGNOSIS — R053 Chronic cough: Secondary | ICD-10-CM | POA: Diagnosis present

## 2022-05-20 ENCOUNTER — Other Ambulatory Visit (HOSPITAL_COMMUNITY): Payer: Self-pay

## 2022-06-14 ENCOUNTER — Other Ambulatory Visit (HOSPITAL_COMMUNITY): Payer: Self-pay

## 2022-06-16 ENCOUNTER — Other Ambulatory Visit (HOSPITAL_COMMUNITY): Payer: Self-pay

## 2022-06-27 ENCOUNTER — Other Ambulatory Visit (HOSPITAL_COMMUNITY): Payer: Self-pay

## 2022-06-27 MED ORDER — PHENTERMINE HCL 37.5 MG PO TABS
37.5000 mg | ORAL_TABLET | Freq: Every day | ORAL | 2 refills | Status: DC
  Filled 2022-06-27: qty 30, 30d supply, fill #0

## 2022-08-31 ENCOUNTER — Other Ambulatory Visit: Payer: Self-pay

## 2022-08-31 ENCOUNTER — Other Ambulatory Visit (HOSPITAL_COMMUNITY): Payer: Self-pay

## 2022-09-30 ENCOUNTER — Other Ambulatory Visit: Payer: Self-pay

## 2022-10-27 ENCOUNTER — Ambulatory Visit (INDEPENDENT_AMBULATORY_CARE_PROVIDER_SITE_OTHER): Payer: 59

## 2022-10-27 ENCOUNTER — Encounter: Payer: Self-pay | Admitting: Emergency Medicine

## 2022-10-27 ENCOUNTER — Ambulatory Visit
Admission: EM | Admit: 2022-10-27 | Discharge: 2022-10-27 | Disposition: A | Discharge status: Home / Self Care | Payer: 59 | Attending: Nurse Practitioner | Admitting: Nurse Practitioner

## 2022-10-27 DIAGNOSIS — M25571 Pain in right ankle and joints of right foot: Secondary | ICD-10-CM

## 2022-10-27 DIAGNOSIS — M7989 Other specified soft tissue disorders: Secondary | ICD-10-CM | POA: Diagnosis not present

## 2022-10-27 NOTE — ED Triage Notes (Signed)
Right ankle pain.  Swelling, no known injury.  States she did wear some new shoes for 3 days.  Unsure if this is causing the pain.  Hurts worse with walking.  Has taking NSAIDs with no relief.

## 2022-10-27 NOTE — Discharge Instructions (Addendum)
As we discussed, the ankle x-ray today does not show any acute fractures in your ankle bone.  I wonder if you may have a stress fracture in your ankle.  Please wear the Ace wrap when you are sleeping or resting at home; also keep your right foot elevated and iced 15 minutes on, 45 minutes off.  Use the lace up brace whenever you are weightbearing.  Continue ibuprofen and Tylenol as needed for pain  Follow-up with podiatry or Dr. Aline Brochure with no improvement or worsening of symptoms despite treatment.  Contact information for podiatry is below.

## 2022-10-27 NOTE — ED Provider Notes (Signed)
[facility]    CSN: [id number] Arrival date & time: 10/27/22  7829      History   Chief Complaint No chief complaint on file.   HPI Kayla Meyer is a 57 y.o. female.   Patient presents today for approximately 3-week history of right ankle pain.  Reports the pain began after she changed shoes.  No recent accident, fall, trauma, or injury to the ankle.  Reports that she has had a ankle fracture in the left ankle from an inversion injury.  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  Reports after working, she does have swelling and pain on the outside of her ankle and moving to the top of her foot and lower right leg.  Reports when she rests, ices her foot, and keeps it elevated, the pain gets better.  After she works a shift, the pain gets worse.  Pain is not improved with NSAIDs or Tylenol.  No weakness with weightbearing or walking.  No redness, bruising, numbness or tingling in the toes, or fevers, nausea/vomiting since the pain began.    Past Medical History:  Diagnosis Date   Migraine    Tachycardia     Patient Active Problem List   Diagnosis Date Noted   Fracture of fifth metatarsal bone of left foot 09/07/2015    Past Surgical History:  Procedure Laterality Date   ABDOMINAL HYSTERECTOMY     CERVICAL ABLATION     TUBAL LIGATION      OB History   No obstetric history on file.      Home Medications    Prior to Admission medications   Medication Sig Start Date End Date Taking? Authorizing Provider  ALPRAZolam Duanne Moron) 0.5 MG tablet Take 1-2 tablets (0.5-1 mg total) by mouth as needed for sleep 04/25/22     COVID-19 At Home Antigen Test [facility] COVID-19 HOME TEST) KIT Use as directed 12/06/21   Jefm Bryant, RPH  escitalopram (LEXAPRO) 10 MG tablet Take 1 tablet (10 mg total) by mouth daily. 03/04/21     HYDROcodone-acetaminophen (NORCO/VICODIN) 5-325 MG tablet Take 1-2 tablets by mouth every 4 (four) hours as needed. 04/29/20    Orpah Greek, MD  ibuprofen (ADVIL,MOTRIN) 800 MG tablet Take 1 tablet (800 mg total) by mouth every 8 (eight) hours as needed. 09/07/15   Carole Civil, MD  loratadine (CLARITIN) 10 MG tablet Take 10 mg by mouth daily.    [provider]  methocarbamol (ROBAXIN) 500 MG tablet Take 1 tablet (500 mg total) by mouth every 8 (eight) hours as needed for muscle spasms. 04/29/20   Orpah Greek, MD  phentermine (ADIPEX-P) 37.5 MG tablet Take 1 tablet (37.5 mg total) by mouth daily. 06/27/22       Family History History reviewed. No pertinent family history.  Social History Social History   Tobacco Use   Smoking status: Every Day    Packs/day: 0.33    Types: Cigarettes   Smokeless tobacco: Never  Substance Use Topics   Alcohol use: Yes    Comment: rarely   Drug use: No     Allergies   Sulfa antibiotics   Review of Systems Review of Systems Per HPI  Physical Exam Triage Vital Signs ED Triage Vitals  Enc Vitals Group     BP 10/27/22 1025 (!) 152/85     Pulse Rate 10/27/22 1025 76     Resp 10/27/22 1025 18     Temp  10/27/22 1025 97.9 F (36.6 C)     Temp Source 10/27/22 1025 Oral     SpO2 10/27/22 1025 95 %     Weight --      Height --      Head Circumference --      Peak Flow --      Pain Score 10/27/22 1026 7     Pain Loc --      Pain Edu? --      Excl. in [place]? --    No data found.  Updated Vital Signs BP (!) 152/85 (BP Location: Right Arm)   Pulse 76   Temp 97.9 F (36.6 C) (Oral)   Resp 18   SpO2 95%   Visual Acuity Right Eye Distance:   Left Eye Distance:   Bilateral Distance:    Right Eye Near:   Left Eye Near:    Bilateral Near:     Physical Exam Vitals and nursing note reviewed.  Constitutional:      General: She is not in acute distress.    Appearance: Normal appearance. She is not toxic-appearing.  HENT:     Mouth/Throat:     Mouth: Mucous membranes are moist.     Pharynx: Oropharynx is clear.  Pulmonary:      Effort: Pulmonary effort is normal. No respiratory distress.  Musculoskeletal:     Right ankle: Swelling present. No deformity or ecchymosis. Tenderness present over the lateral malleolus and posterior TF ligament. No proximal fibula tenderness. Normal range of motion.     Right Achilles Tendon: Normal. No tenderness. Thompson's test negative.     Right foot: Normal range of motion and normal capillary refill. Swelling present. No deformity, tenderness or bony tenderness. Normal pulse.     Comments: Inspection: Mild swelling to the distal fibula, lateral malleolus diffusely; no obvious deformity or redness Palpation: Distal fibula tender to palpation diffusely; there is also diffuse tenderness around the lateral malleolus; no obvious deformities palpated ROM: Full ROM to ankle and flexibility of foot  Strength: 5/5 right ankle Neurovascular: neurovascularly intact in right lower extremity   Skin:    General: Skin is warm and dry.     Capillary Refill: Capillary refill takes less than 2 seconds.     Coloration: Skin is not jaundiced or pale.     Findings: No erythema.  Neurological:     Mental Status: She is alert and oriented to person, place, and time.  Psychiatric:        Behavior: Behavior is cooperative.      UC Treatments / Results  Labs (all labs ordered are listed, but only abnormal results are displayed) Labs Reviewed - No data to display  EKG   Radiology DG Ankle Complete Right  Result Date: 10/27/2022 CLINICAL DATA:  pain and swelling in right ankle EXAM: RIGHT ANKLE - COMPLETE 3 VIEW COMPARISON:  None Available. FINDINGS: There is no evidence of fracture, dislocation, or joint effusion. There is no evidence of arthropathy or other focal bone abnormality. Posterior and plantar calcaneal spurs identified. Soft tissue swelling noted at the lateral malleolus. IMPRESSION: Soft tissue swelling. Calcaneal spurs. No acute osseous abnormality identified. Electronically Signed    By: Sammie Bench M.D.   On: 10/27/2022 10:48    Procedures Procedures (including critical care time)  Medications Ordered in UC Medications - No data to display  Initial Impression / Assessment and Plan / UC Course  I have reviewed the triage vital signs and the nursing notes.  Pertinent labs & imaging results that were available during my care of the patient were reviewed by me and considered in my medical decision making (see chart for details).   Patient is well-appearing, normotensive, afebrile, not tachycardic, not tachypneic, oxygenating well on room air.    1. Acute right ankle pain X-ray imaging does not show any acute osseous abnormality, however did note soft tissue swelling Also appreciates calcaneal spurs which the patient is already aware of I am suspicious for stress fracture of right ankle Recommended lace up brace when weightbearing, which patient has been wearing Ace wrap applied today for support while not weightbearing Continue pain control with Tylenol/ibuprofen, ice, elevation, and rest Recommended follow-up with podiatry with no improvement or worsening of symptoms despite treatment  The patient was given the opportunity to ask questions.  All questions answered to their satisfaction.  The patient is in agreement to this plan.    Final Clinical Impressions(s) / UC Diagnoses   Final diagnoses:  Acute right ankle pain     Discharge Instructions      As we discussed, the ankle x-ray today does not show any acute fractures in your ankle bone.  I wonder if you may have a stress fracture in your ankle.  Please wear the Ace wrap when you are sleeping or resting at home; also keep your right foot elevated and iced 15 minutes on, 45 minutes off.  Use the lace up brace whenever you are weightbearing.  Continue ibuprofen and Tylenol as needed for pain  Follow-up with podiatry or Dr. Aline Brochure with no improvement or worsening of symptoms despite treatment.  Contact  information for podiatry is below.     ED Prescriptions   None    PDMP not reviewed this encounter.   Eulogio Bear, NP 10/27/22 [id number]

## 2022-11-02 ENCOUNTER — Telehealth: Payer: Self-pay | Admitting: Orthopedic Surgery

## 2022-11-02 NOTE — Telephone Encounter (Signed)
Returned the patient's call, lvm for her to call us back.  She was seen at [facility] on 2/08 for her rt ankle.

## 2022-11-17 ENCOUNTER — Encounter: Payer: Self-pay | Admitting: Radiology

## 2022-11-17 ENCOUNTER — Other Ambulatory Visit: Payer: Self-pay

## 2022-11-17 ENCOUNTER — Other Ambulatory Visit (HOSPITAL_COMMUNITY): Payer: Self-pay

## 2023-12-04 IMAGING — US US BREAST*R* LIMITED INC AXILLA
1 series · 13 of 15 positions shown · non-contrast
Comparison: Previous exam(s).

CLINICAL DATA: Follow-up probably benign cystic changes in the
upper outer right breast and upper inner right breast seen on
03/10/2021 and 03/24/2021.

EXAM:
DIGITAL DIAGNOSTIC UNILATERAL RIGHT MAMMOGRAM WITH TOMOSYNTHESIS AND
CAD; ULTRASOUND RIGHT BREAST LIMITED
TECHNIQUE: Right digital diagnostic mammography and breast tomosynthesis was
performed. The images were evaluated with computer-aided detection.;
Targeted ultrasound examination of the right breast was performed

[Series 1: us breast*right* limited inc axilla · 0.07mm/px · 13 of 15 slices shown]
[im 1/15]
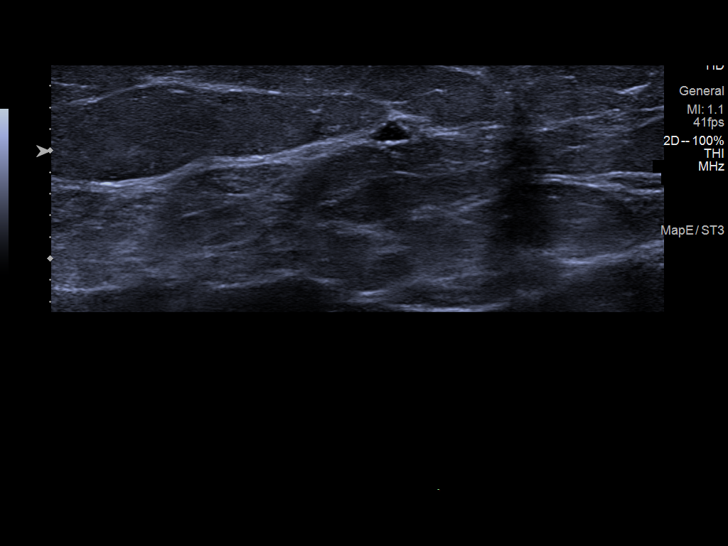
[im 2/15]
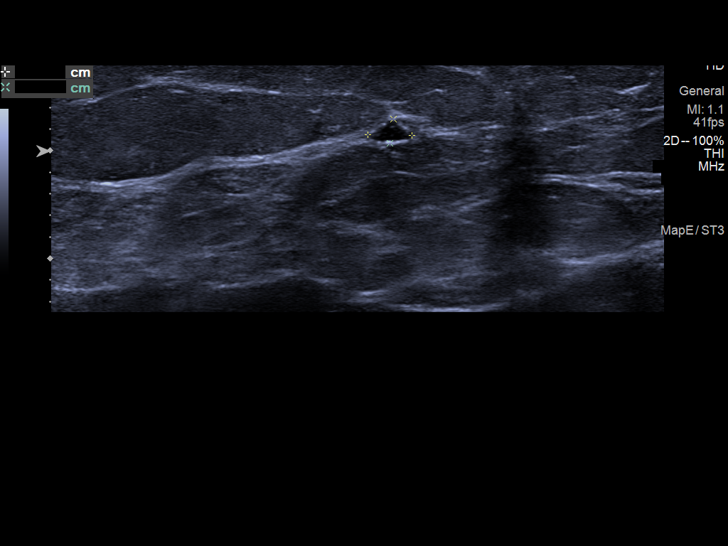
[im 3/15]
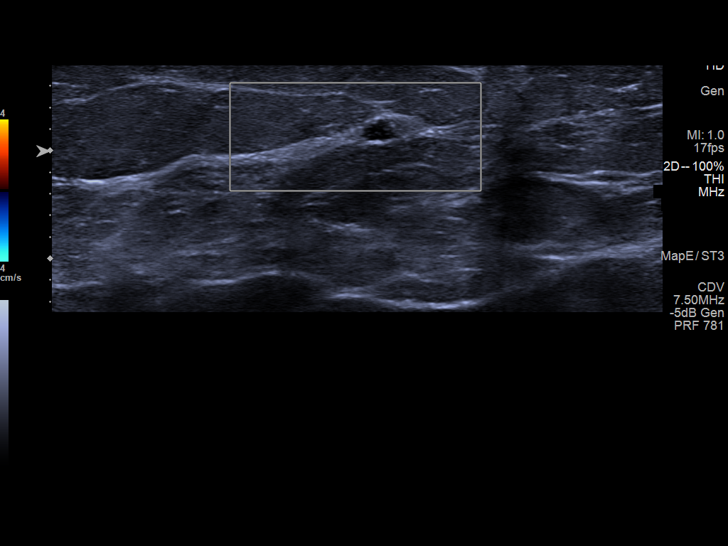
[im 5/15]
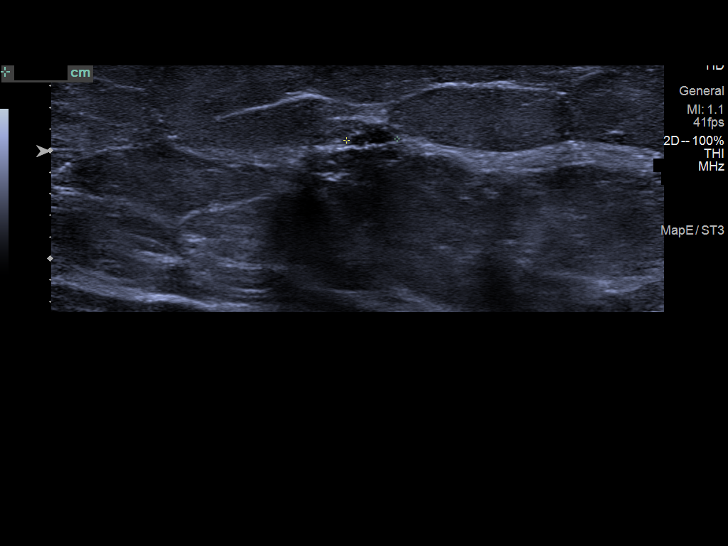
[im 6/15]
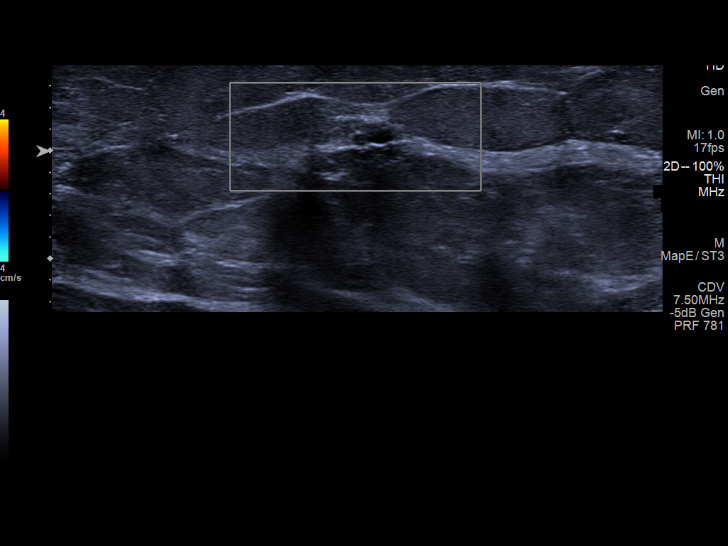
[im 7/15]
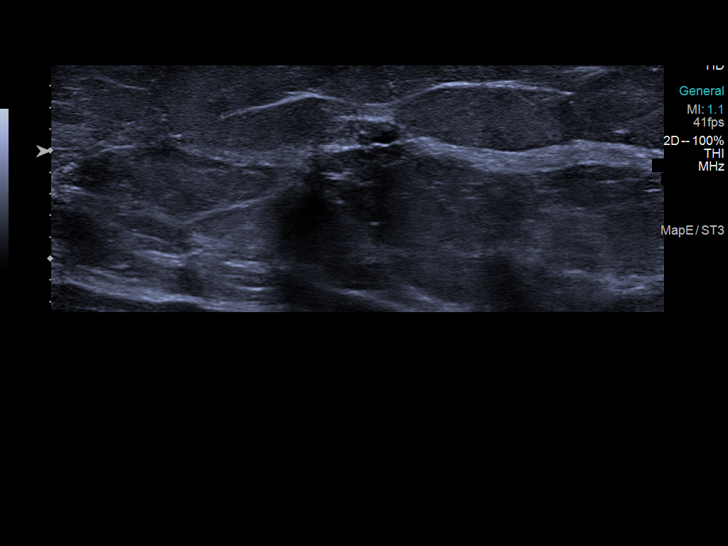
[im 8/15]
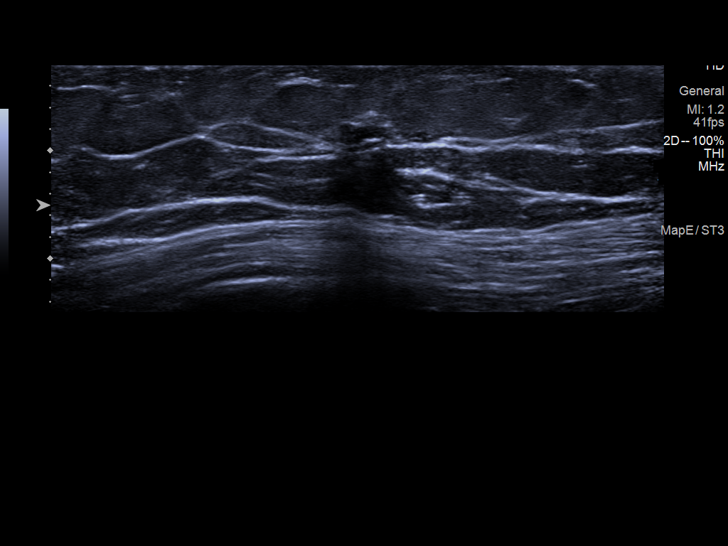
[im 9/15]
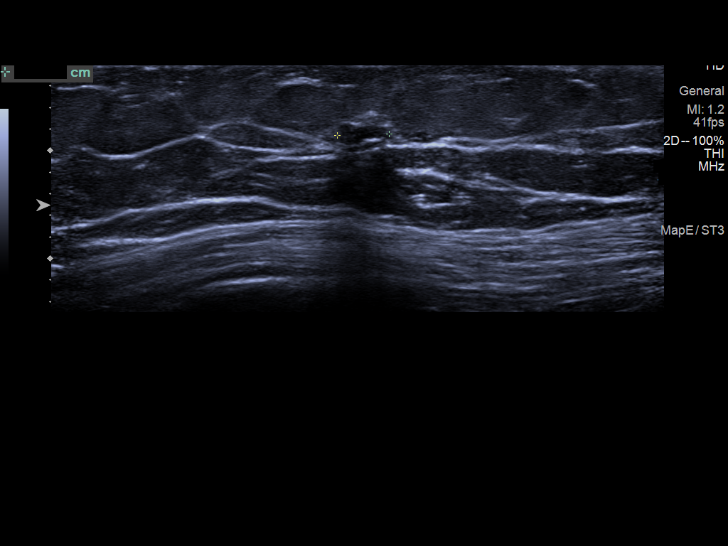
[im 10/15]
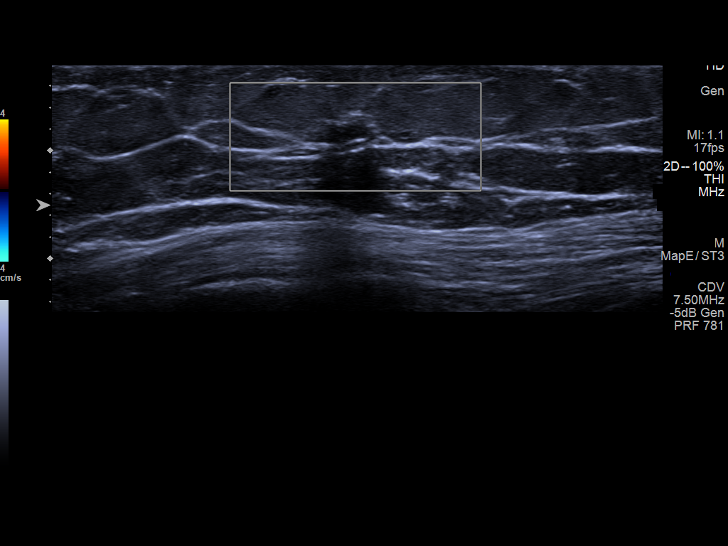
[im 11/15]
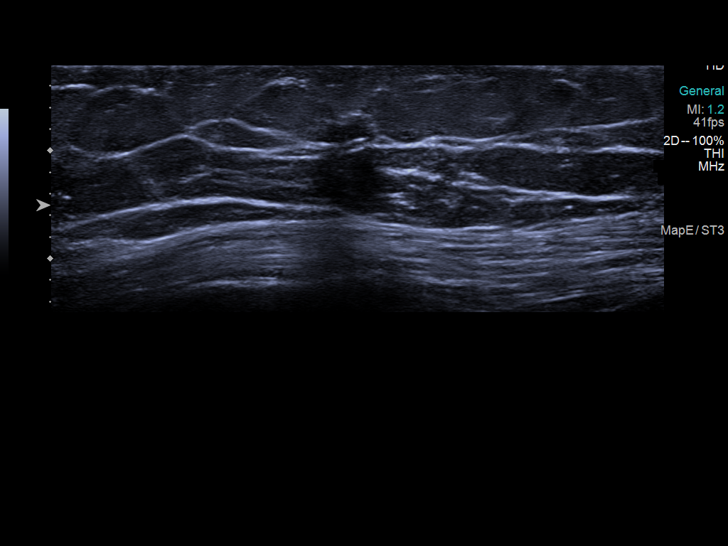
[im 13/15]
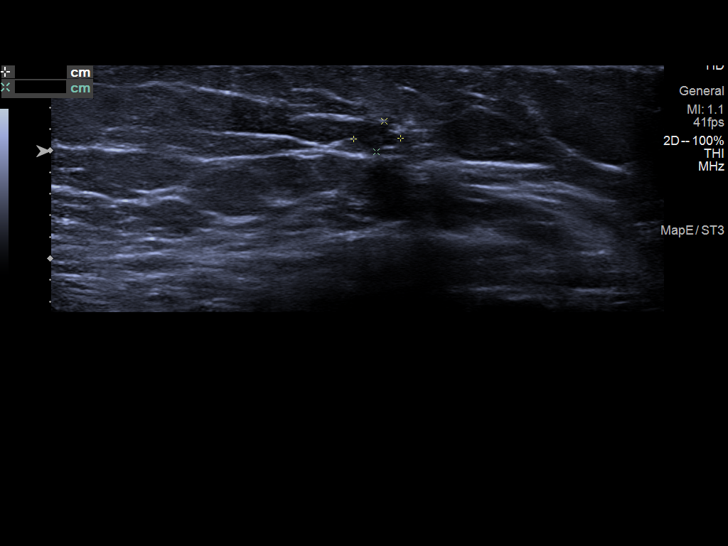
[im 14/15]
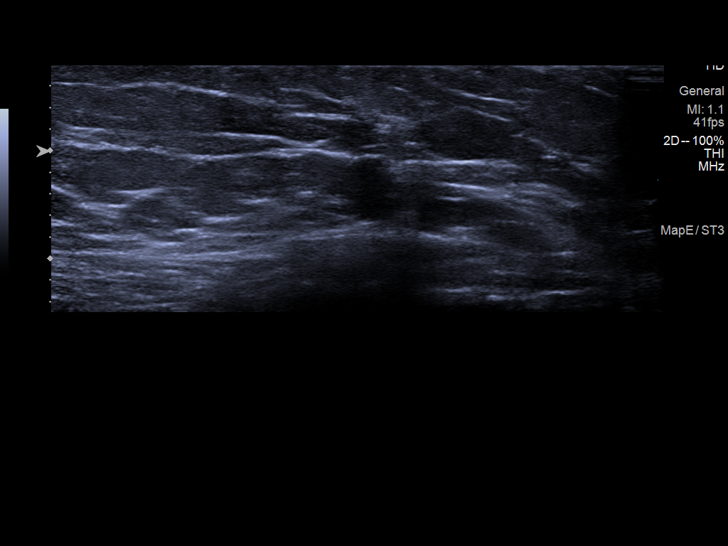
[im 15/15]
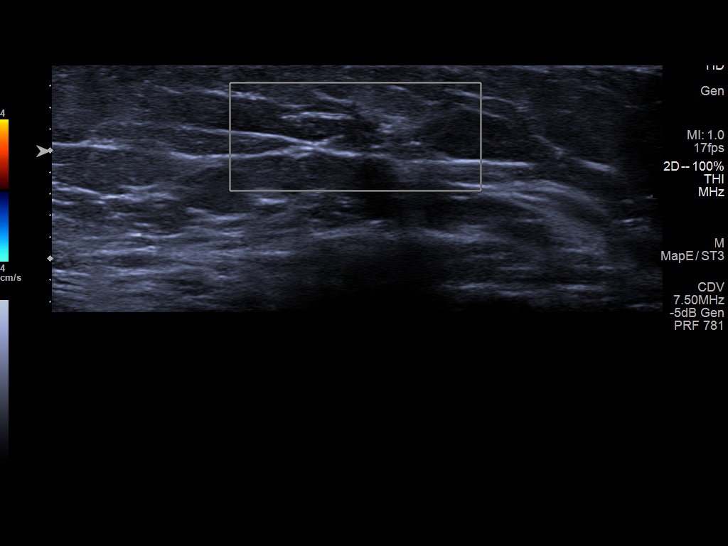

[13 of 15 positions shown; findings below may reference images not displayed]

ACR Breast Density Category b: There are scattered areas of
fibroglandular density.
FINDINGS: The previously demonstrated small, oval, circumscribed mass in the
posterior outer right breast has not changed significantly. There is
also been no significant change in a small, circumscribed,
multilobulated mass in the medial right breast.

Targeted ultrasound is performed, showing a 5 x 4 x 2 mm oval,
horizontally oriented cyst with some internal echoes in the 10
o'clock position of the right breast, 7 cm from the nipple. This
measured 5 x 4 x 2 mm on 03/24/2021. No internal blood flow was seen
with color Doppler.

A 5 x 4 x 3 mm similar appearing cyst is demonstrated in the 1
o'clock position of the right breast, 10 cm from nipple, without
significant change.
IMPRESSION: No significant change in 2 small, mildly complicated, probably
benign right breast cysts.

RECOMMENDATION:
Bilateral diagnostic mammogram and right breast ultrasound in 4
months. That will be 1 year since mammographic evaluation of the
left breast.

I have discussed the findings and recommendations with the patient.
If applicable, a reminder letter will be sent to the patient
regarding the next appointment.

BI-RADS CATEGORY  3: Probably benign.

## 2023-12-04 IMAGING — MG MM DIGITAL DIAGNOSTIC UNILAT*R* W/ TOMO W/ CAD
6 series · 6 of 18 positions shown · non-contrast
Comparison: Previous exam(s).

CLINICAL DATA: Follow-up probably benign cystic changes in the
upper outer right breast and upper inner right breast seen on
03/10/2021 and 03/24/2021.

EXAM:
DIGITAL DIAGNOSTIC UNILATERAL RIGHT MAMMOGRAM WITH TOMOSYNTHESIS AND
CAD; ULTRASOUND RIGHT BREAST LIMITED
TECHNIQUE: Right digital diagnostic mammography and breast tomosynthesis was
performed. The images were evaluated with computer-aided detection.;
Targeted ultrasound examination of the right breast was performed

[R MLO synth-2D (1 of 2)]
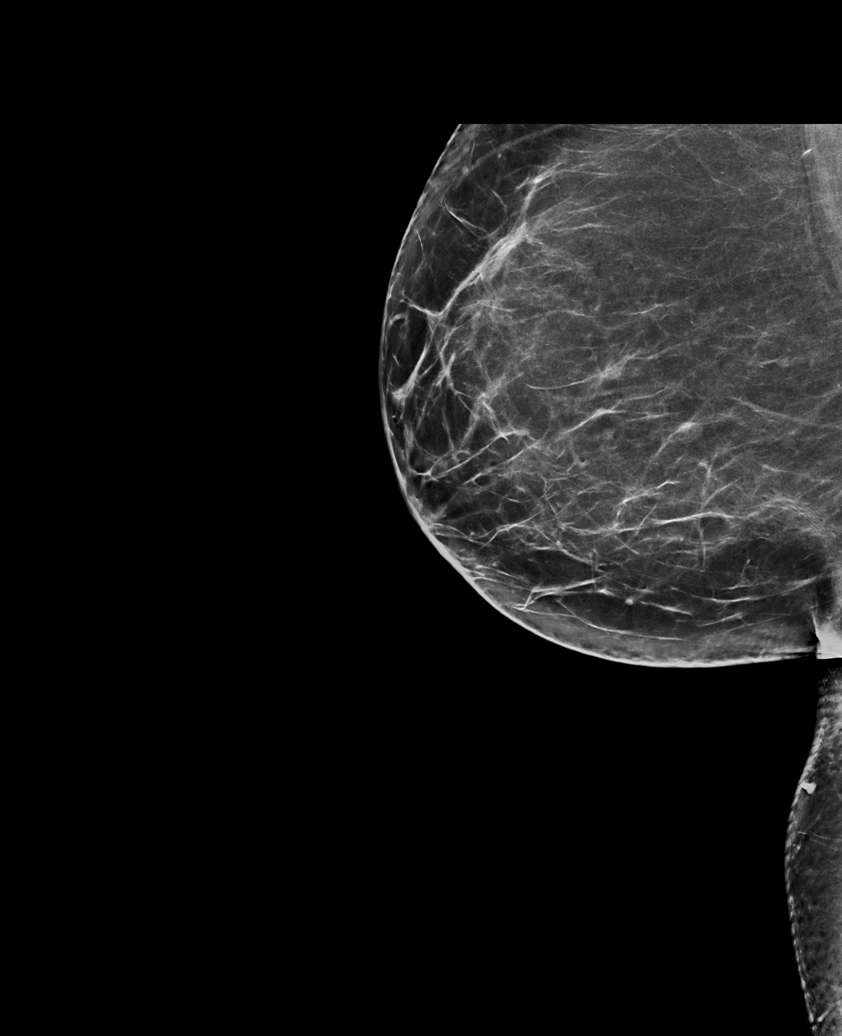

[R CC synth-2D]
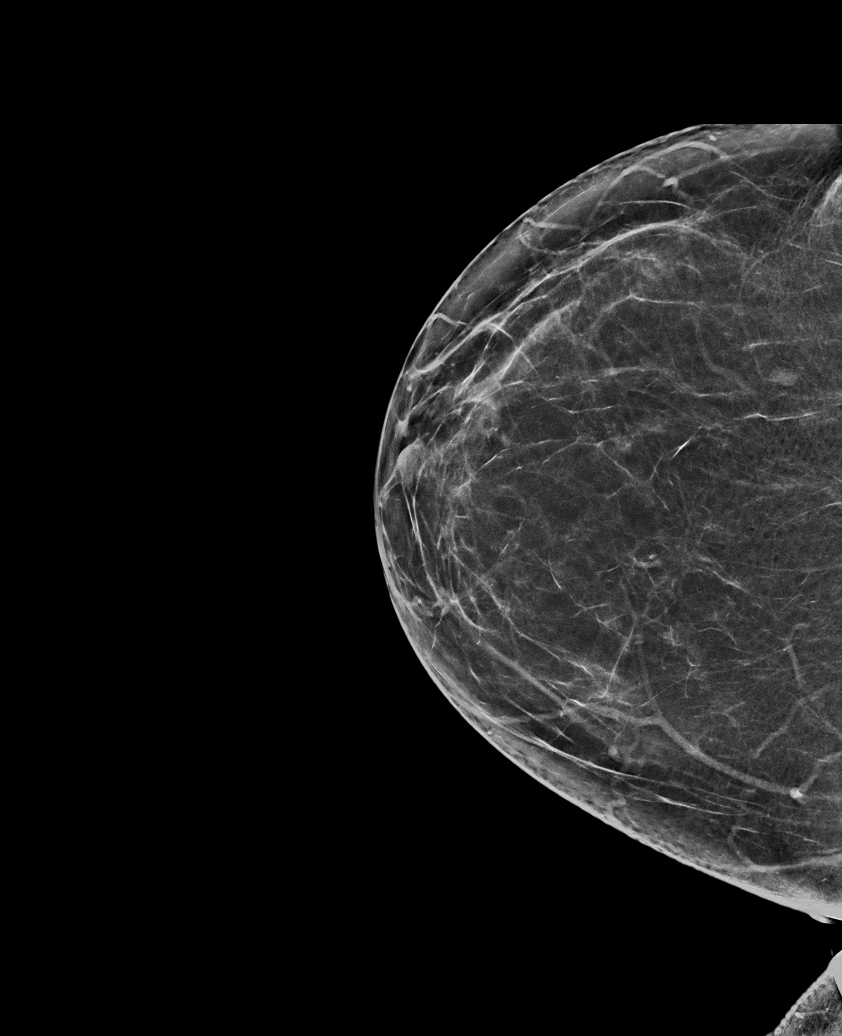

[R MLO synth-2D (2 of 2)]
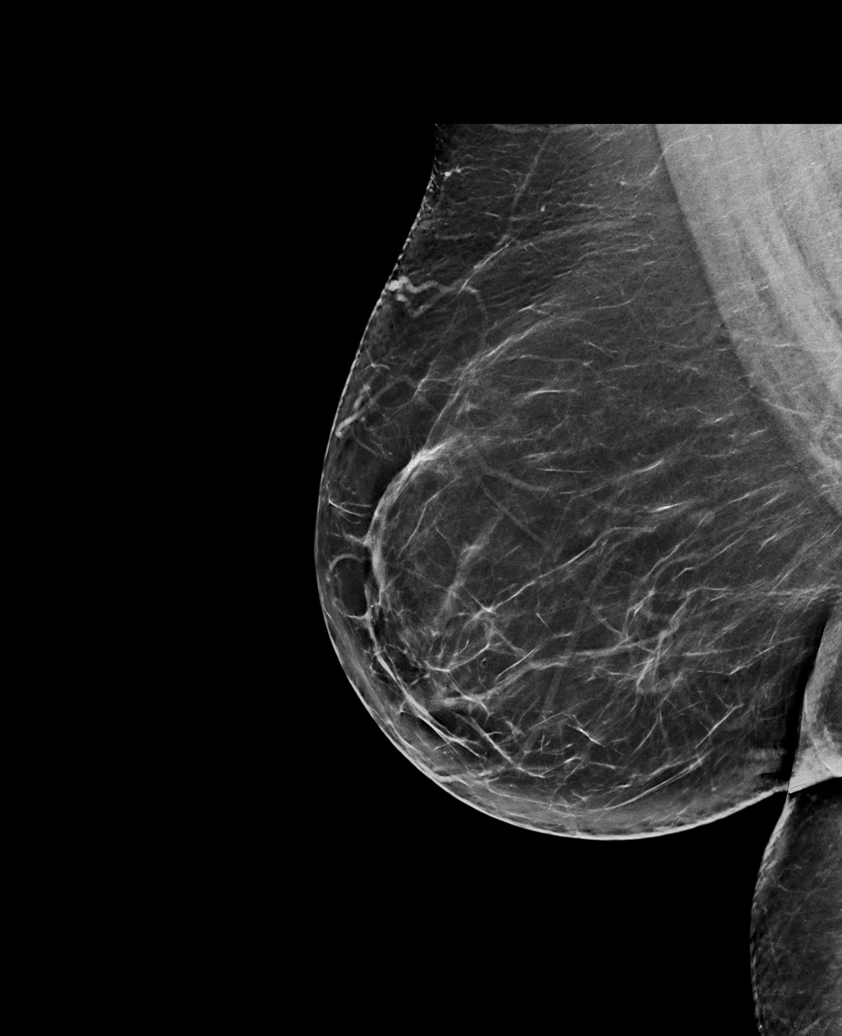

[R CC tomo · tomo slice 38/75.0]
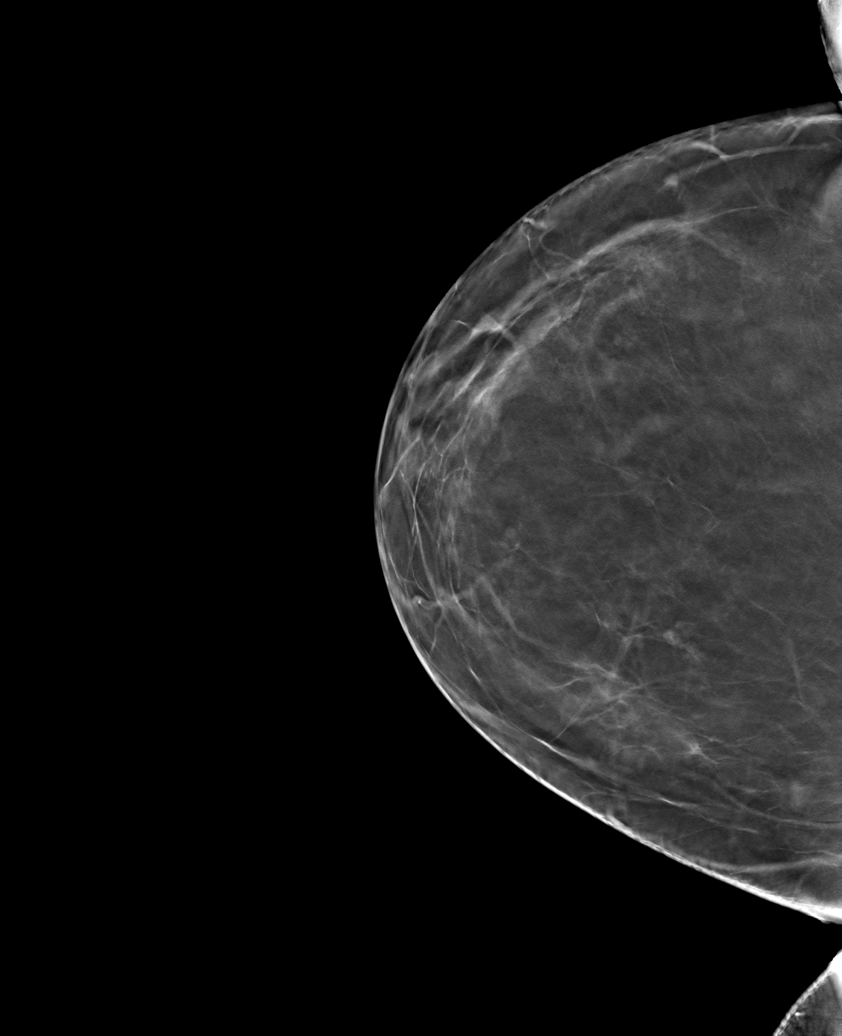

[R MLO tomo (1 of 2) · tomo slice 43/85.0]
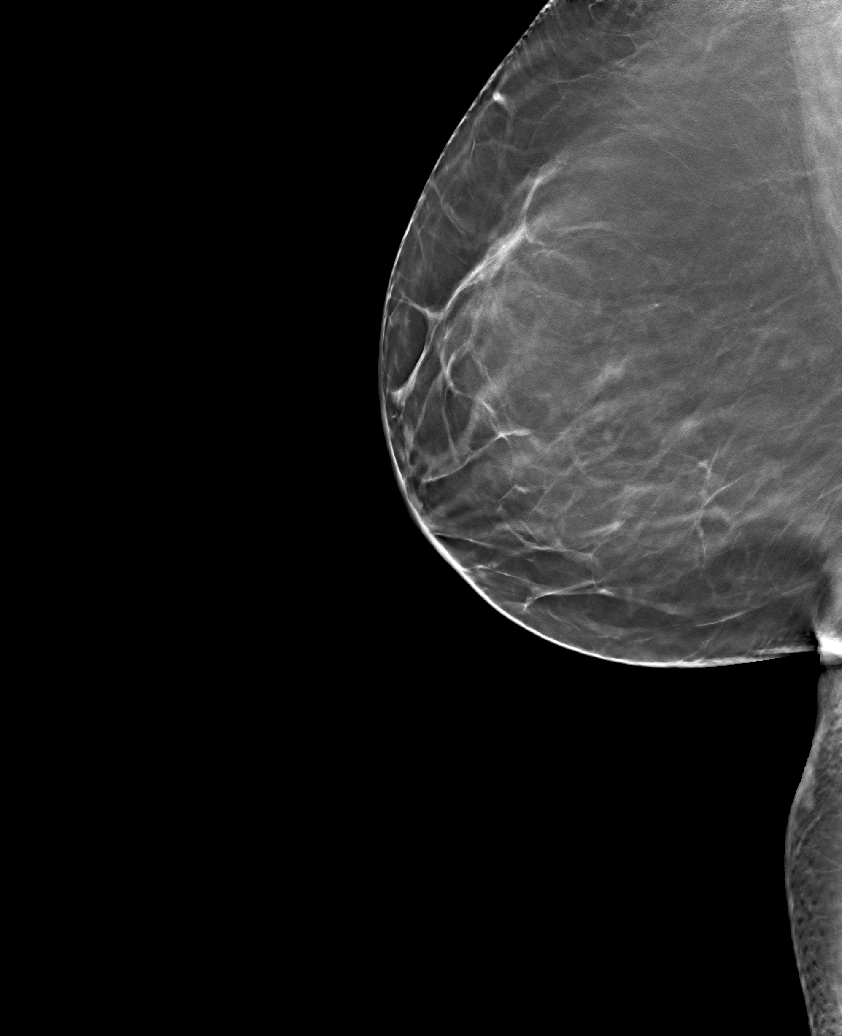

[R MLO tomo (2 of 2) · tomo slice 43/86.0]
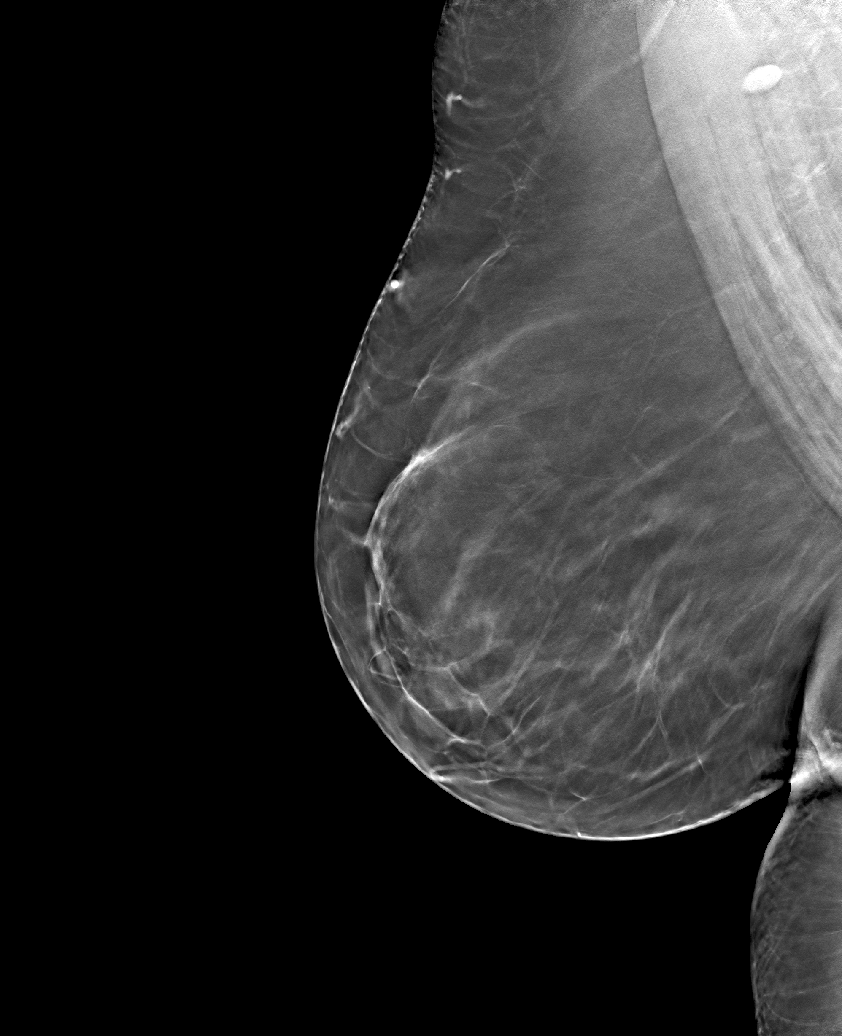

[6 of 18 positions shown; findings below may reference images not displayed]

ACR Breast Density Category b: There are scattered areas of
fibroglandular density.
FINDINGS: The previously demonstrated small, oval, circumscribed mass in the
posterior outer right breast has not changed significantly. There is
also been no significant change in a small, circumscribed,
multilobulated mass in the medial right breast.

Targeted ultrasound is performed, showing a 5 x 4 x 2 mm oval,
horizontally oriented cyst with some internal echoes in the 10
o'clock position of the right breast, 7 cm from the nipple. This
measured 5 x 4 x 2 mm on 03/24/2021. No internal blood flow was seen
with color Doppler.

A 5 x 4 x 3 mm similar appearing cyst is demonstrated in the 1
o'clock position of the right breast, 10 cm from nipple, without
significant change.
IMPRESSION: No significant change in 2 small, mildly complicated, probably
benign right breast cysts.

RECOMMENDATION:
Bilateral diagnostic mammogram and right breast ultrasound in 4
months. That will be 1 year since mammographic evaluation of the
left breast.

I have discussed the findings and recommendations with the patient.
If applicable, a reminder letter will be sent to the patient
regarding the next appointment.

BI-RADS CATEGORY  3: Probably benign.

## 2024-08-02 ENCOUNTER — Telehealth: Payer: Self-pay

## 2024-08-02 ENCOUNTER — Ambulatory Visit (INDEPENDENT_AMBULATORY_CARE_PROVIDER_SITE_OTHER): Payer: Self-pay

## 2024-08-02 VITALS — BP 134/68 | HR 66 | Ht 64.0 in | Wt 241.0 lb

## 2024-08-02 DIAGNOSIS — G4733 Obstructive sleep apnea (adult) (pediatric): Secondary | ICD-10-CM

## 2024-08-02 DIAGNOSIS — Z6841 Body Mass Index (BMI) 40.0 and over, adult: Secondary | ICD-10-CM | POA: Diagnosis not present

## 2024-08-02 DIAGNOSIS — Z1231 Encounter for screening mammogram for malignant neoplasm of breast: Secondary | ICD-10-CM

## 2024-08-02 MED ORDER — TIRZEPATIDE-WEIGHT MANAGEMENT 5 MG/0.5ML ~~LOC~~ SOLN: 5.0000 mg | SUBCUTANEOUS | 2 refills | Status: DC

## 2024-08-02 NOTE — Telephone Encounter (Signed)
 error

## 2024-08-02 NOTE — Progress Notes (Signed)
 New Patient Office Visit  Subjective    Patient ID: Kayla Meyer, female    DOB: [birth year]  Age: 58 y.o. MRN: [id number]  CC:  Chief Complaint  Patient presents with   Establish Care    Pt here to establish care       HPI Kayla Meyer presents to establish care  Discussed the use of AI scribe software for clinical note transcription with the patient, who gave verbal consent to proceed.  History of Present Illness    Kayla Meyer is a 58 year old female who presents for a refill and dosage adjustment of Zepbound.  Anti-obesity pharmacotherapy - Currently taking Zepbound 2.5 mg weekly, with four refills completed - Considering increasing dose to 5 mg - Medication reduces appetite and increases awareness of eating habits - Decreased intake of snacks such as cookies and chips - Experiences excessive fullness and discomfort when overeating - Difficulty obtaining Zepbound from local pharmacies in [place] ; currently receives medication directly from [facility] - Has one remaining dose of 2.5 mg, taken on Fridays  Cardiac symptoms - History of palpitations - Previous ambulatory heart monitor showed occasional runs of tachycardia - Possible mitral valve prolapse suggested but not confirmed - No current palpitations, chest pain, shortness of breath, or other cardiac symptoms  Thyroid  function - History of thyroid  dysfunction requiring medication - Recent laboratory evaluation shows normal thyroid  function - No current symptoms of thyroid  dysfunction  Preventive health maintenance - Active on MyChart - Needs to catch up on mammogram screening - Needs shingles and COVID booster vaccinations     Outpatient Encounter Medications as of 08/02/2024  Medication Sig   acetaminophen  (TYLENOL ) 500 MG tablet Take 500 mg by mouth every 6 (six) hours as needed for mild pain (pain score 1-3).   ALPRAZolam  (XANAX ) 0.5 MG tablet Take 1-2 tablets (0.5-1 mg total) by mouth as  needed for sleep   aspirin 81 MG chewable tablet Chew 81 mg by mouth daily.   Cholecalciferol (VITAMIN D3) 75 MCG (3000 UT) TABS Take 3,000 Int'l Units/1.7m2 by mouth once.   docusate sodium (COLACE) 100 MG capsule Take 100 mg by mouth daily.   famotidine (PEPCID) 40 MG tablet Take 40 mg by mouth daily.   ibuprofen  (ADVIL ) 600 MG tablet Take 600 mg by mouth every 6 (six) hours as needed for mild pain (pain score 1-3).   tirzepatide 5 MG/0.5ML injection vial Inject 5 mg into the skin once a week.   vitamin B-12 (CYANOCOBALAMIN) 500 MCG tablet Take 500 mcg by mouth daily.   [DISCONTINUED] tirzepatide (ZEPBOUND) 2.5 MG/0.5ML Pen Inject 2.5 mg into the skin once a week.   [DISCONTINUED] COVID-19 At Home Antigen Test (CARESTART COVID-19 HOME TEST) KIT Use as directed   [DISCONTINUED] escitalopram  (LEXAPRO ) 10 MG tablet Take 1 tablet (10 mg total) by mouth daily.   [DISCONTINUED] HYDROcodone -acetaminophen  (NORCO/VICODIN) 5-325 MG tablet Take 1-2 tablets by mouth every 4 (four) hours as needed.   [DISCONTINUED] ibuprofen  (ADVIL ,MOTRIN ) 800 MG tablet Take 1 tablet (800 mg total) by mouth every 8 (eight) hours as needed.   [DISCONTINUED] loratadine (CLARITIN) 10 MG tablet Take 10 mg by mouth daily.   [DISCONTINUED] methocarbamol  (ROBAXIN ) 500 MG tablet Take 1 tablet (500 mg total) by mouth every 8 (eight) hours as needed for muscle spasms.   [DISCONTINUED] phentermine  (ADIPEX-P ) 37.5 MG tablet Take 1 tablet (37.5 mg total) by mouth daily.   No facility-administered encounter medications on file as of 08/02/2024.  Past Medical History:  Diagnosis Date   Migraine    Tachycardia     Past Surgical History:  Procedure Laterality Date   ABDOMINAL HYSTERECTOMY     CERVICAL ABLATION     TUBAL LIGATION      History reviewed. No pertinent family history.  Social History   Socioeconomic History   Marital status: Married    Spouse name: Not on file   Number of children: Not on file   Years of  education: Not on file   Highest education level: Bachelor's degree (e.g., BA, AB, BS)  Occupational History   Not on file  Tobacco Use   Smoking status: Every Day    Current packs/day: 0.33    Types: Cigarettes   Smokeless tobacco: Never  Substance and Sexual Activity   Alcohol use: Yes    Comment: rarely   Drug use: No   Sexual activity: Yes    Birth control/protection: Surgical  Other Topics Concern   Not on file  Social History Narrative   Not on file   Social Drivers of Health   Financial Resource Strain: Low Risk  (07/29/2024)   Overall Financial Resource Strain (CARDIA)    Difficulty of Paying Living Expenses: Not hard at all  Food Insecurity: No Food Insecurity (07/29/2024)   Hunger Vital Sign    Worried About Running Out of Food in the Last Year: Never true    Ran Out of Food in the Last Year: Never true  Transportation Needs: No Transportation Needs (07/29/2024)   PRAPARE - Administrator, Civil Service (Medical): No    Lack of Transportation (Non-Medical): No  Physical Activity: Insufficiently Active (07/29/2024)   Exercise Vital Sign    Days of Exercise per Week: 1 day    Minutes of Exercise per Session: 10 min  Stress: No Stress Concern Present (07/29/2024)   [employer] of Occupational Health - Occupational Stress Questionnaire    Feeling of Stress: Only a little  Social Connections: Unknown (07/29/2024)   Social Connection and Isolation Panel    Frequency of Communication with Friends and Family: Once a week    Frequency of Social Gatherings with Friends and Family: Patient declined    Attends Religious Services: Patient declined    Database Administrator or Organizations: Patient declined    Attends Engineer, Structural: Not on file    Marital Status: Married  Catering Manager Violence: Not on file    ROS      Objective    BP 134/68 (BP Location: Left Arm, Patient Position: Sitting, Cuff Size: Normal)   Pulse 66    Ht 5' 4 (1.626 m)   Wt 241 lb (109.3 kg)   SpO2 95%   BMI 41.37 kg/m   Physical Exam Vitals and nursing note reviewed.  Constitutional:      Appearance: Normal appearance. She is obese.  HENT:     Head: Normocephalic.  Eyes:     Extraocular Movements: Extraocular movements intact.     Pupils: Pupils are equal, round, and reactive to light.  Cardiovascular:     Rate and Rhythm: Normal rate and regular rhythm.  Pulmonary:     Effort: Pulmonary effort is normal.     Breath sounds: Normal breath sounds.  Musculoskeletal:     Cervical back: Normal range of motion and neck supple.  Neurological:     Mental Status: She is alert and oriented to person, place, and time.  Psychiatric:  Mood and Affect: Mood normal.        Thought Content: Thought content normal.         Assessment & Plan:   Problem List Items Addressed This Visit       Respiratory   OSA (obstructive sleep apnea) - Primary (Chronic)   Zepbound 2.5 mg effective in reducing appetite. Considering dose increase for better management. - Increased Zepbound to 5 mg and sent prescription to [facility].      Relevant Medications   tirzepatide 5 MG/0.5ML injection vial     Other   BMI 40.0-44.9, adult (HCC)   Zepbound 2.5 mg effective in reducing appetite. Considering dose increase for better management. - Increased Zepbound to 5 mg and sent prescription to [facility].      Relevant Medications   tirzepatide 5 MG/0.5ML injection vial   Other Visit Diagnoses       Encounter for screening mammogram for malignant neoplasm of breast       Due for screening mammogram. Previous mammograms showed non-bothersome fatty cysts. - Schedule and complete screening mammogram.   Relevant Orders   MM Digital Screening       Return in about 6 months (around 01/30/2025) for chronic follow-up with PCP.   Leita Longs, FNP

## 2024-08-06 DIAGNOSIS — Z6841 Body Mass Index (BMI) 40.0 and over, adult: Secondary | ICD-10-CM | POA: Insufficient documentation

## 2024-08-06 NOTE — Assessment & Plan Note (Signed)
 Zepbound 2.5 mg effective in reducing appetite. Considering dose increase for better management. - Increased Zepbound to 5 mg and sent prescription to [facility].

## 2024-08-30 ENCOUNTER — Other Ambulatory Visit: Payer: Self-pay

## 2024-10-23 ENCOUNTER — Other Ambulatory Visit: Payer: Self-pay

## 2024-10-23 DIAGNOSIS — Z6841 Body Mass Index (BMI) 40.0 and over, adult: Secondary | ICD-10-CM

## 2024-10-23 DIAGNOSIS — G4733 Obstructive sleep apnea (adult) (pediatric): Secondary | ICD-10-CM

## 2025-05-09 ENCOUNTER — Encounter
